# Patient Record
Sex: Female | Born: 1960 | Race: White | Hispanic: No | Marital: Single | Smoking: Never smoker
Health system: Southern US, Community
[De-identification: ages and names within clinical notes are randomized; demographics above are authoritative.]

## PROBLEM LIST (undated history)

## (undated) DIAGNOSIS — Z860100 Personal history of colon polyps, unspecified: Secondary | ICD-10-CM

## (undated) HISTORY — DX: Personal history of colon polyps, unspecified: Z86.0100

## (undated) HISTORY — PX: WISDOM TOOTH EXTRACTION: SHX21

## (undated) HISTORY — DX: Personal history of colonic polyps: Z86.010

## (undated) HISTORY — PX: APPENDECTOMY: SHX54

---

## 2013-10-16 HISTORY — PX: ABDOMINAL HYSTERECTOMY: SHX81

## 2016-06-11 DIAGNOSIS — N390 Urinary tract infection, site not specified: Secondary | ICD-10-CM | POA: Diagnosis not present

## 2016-06-23 ENCOUNTER — Other Ambulatory Visit: Payer: Self-pay | Admitting: *Deleted

## 2016-06-23 ENCOUNTER — Inpatient Hospital Stay
Admission: RE | Admit: 2016-06-23 | Discharge: 2016-06-23 | Disposition: A | Discharge status: Home / Self Care | Payer: Self-pay | Source: Ambulatory Visit | Attending: *Deleted | Admitting: *Deleted

## 2016-06-23 DIAGNOSIS — Z9289 Personal history of other medical treatment: Secondary | ICD-10-CM

## 2016-06-26 ENCOUNTER — Ambulatory Visit (INDEPENDENT_AMBULATORY_CARE_PROVIDER_SITE_OTHER): Payer: BLUE CROSS/BLUE SHIELD | Admitting: Internal Medicine

## 2016-06-26 ENCOUNTER — Encounter: Payer: Self-pay | Admitting: Internal Medicine

## 2016-06-26 VITALS — BP 120/84 | HR 86 | Temp 98.1°F | Ht 65.0 in | Wt 147.5 lb

## 2016-06-26 DIAGNOSIS — R928 Other abnormal and inconclusive findings on diagnostic imaging of breast: Secondary | ICD-10-CM

## 2016-06-26 DIAGNOSIS — Z23 Encounter for immunization: Secondary | ICD-10-CM

## 2016-06-26 DIAGNOSIS — Z0000 Encounter for general adult medical examination without abnormal findings: Secondary | ICD-10-CM

## 2016-06-26 NOTE — Addendum Note (Signed)
Addended by: Jearld Fenton on: 06/26/2016 12:05 PM   Modules accepted: Orders

## 2016-06-26 NOTE — Addendum Note (Signed)
Addended by: Lurlean Nanny on: 06/26/2016 11:53 AM   Modules accepted: Orders

## 2016-06-26 NOTE — Patient Instructions (Signed)
Health Maintenance, Female Adopting a healthy lifestyle and getting preventive care can go a long way to promote health and wellness. Talk with your health care provider about what schedule of regular examinations is right for you. This is a good chance for you to check in with your provider about disease prevention and staying healthy. In between checkups, there are plenty of things you can do on your own. Experts have done a lot of research about which lifestyle changes and preventive measures are most likely to keep you healthy. Ask your health care provider for more information. WEIGHT AND DIET  Eat a healthy diet  Be sure to include plenty of vegetables, fruits, low-fat dairy products, and lean protein.  Do not eat a lot of foods high in solid fats, added sugars, or salt.  Get regular exercise. This is one of the most important things you can do for your health.  Most adults should exercise for at least 150 minutes each week. The exercise should increase your heart rate and make you sweat (moderate-intensity exercise).  Most adults should also do strengthening exercises at least twice a week. This is in addition to the moderate-intensity exercise.  Maintain a healthy weight  Body mass index (BMI) is a measurement that can be used to identify possible weight problems. It estimates body fat based on height and weight. Your health care provider can help determine your BMI and help you achieve or maintain a healthy weight.  For females 20 years of age and older:   A BMI below 18.5 is considered underweight.  A BMI of 18.5 to 24.9 is normal.  A BMI of 25 to 29.9 is considered overweight.  A BMI of 30 and above is considered obese.  Watch levels of cholesterol and blood lipids  You should start having your blood tested for lipids and cholesterol at 56 years of age, then have this test every 5 years.  You may need to have your cholesterol levels checked more often if:  Your lipid  or cholesterol levels are high.  You are older than 56 years of age.  You are at high risk for heart disease.  CANCER SCREENING   Lung Cancer  Lung cancer screening is recommended for adults 55-80 years old who are at high risk for lung cancer because of a history of smoking.  A yearly low-dose CT scan of the lungs is recommended for people who:  Currently smoke.  Have quit within the past 15 years.  Have at least a 30-pack-year history of smoking. A pack year is smoking an average of one pack of cigarettes a day for 1 year.  Yearly screening should continue until it has been 15 years since you quit.  Yearly screening should stop if you develop a health problem that would prevent you from having lung cancer treatment.  Breast Cancer  Practice breast self-awareness. This means understanding how your breasts normally appear and feel.  It also means doing regular breast self-exams. Let your health care provider know about any changes, no matter how small.  If you are in your 20s or 30s, you should have a clinical breast exam (CBE) by a health care provider every 1-3 years as part of a regular health exam.  If you are 40 or older, have a CBE every year. Also consider having a breast X-ray (mammogram) every year.  If you have a family history of breast cancer, talk to your health care provider about genetic screening.  If you   are at high risk for breast cancer, talk to your health care provider about having an MRI and a mammogram every year.  Breast cancer gene (BRCA) assessment is recommended for women who have family members with BRCA-related cancers. BRCA-related cancers include:  Breast.  Ovarian.  Tubal.  Peritoneal cancers.  Results of the assessment will determine the need for genetic counseling and BRCA1 and BRCA2 testing. Cervical Cancer Your health care provider may recommend that you be screened regularly for cancer of the pelvic organs (ovaries, uterus, and  vagina). This screening involves a pelvic examination, including checking for microscopic changes to the surface of your cervix (Pap test). You may be encouraged to have this screening done every 3 years, beginning at age 21.  For women ages 30-65, health care providers may recommend pelvic exams and Pap testing every 3 years, or they may recommend the Pap and pelvic exam, combined with testing for human papilloma virus (HPV), every 5 years. Some types of HPV increase your risk of cervical cancer. Testing for HPV may also be done on women of any age with unclear Pap test results.  Other health care providers may not recommend any screening for nonpregnant women who are considered low risk for pelvic cancer and who do not have symptoms. Ask your health care provider if a screening pelvic exam is right for you.  If you have had past treatment for cervical cancer or a condition that could lead to cancer, you need Pap tests and screening for cancer for at least 20 years after your treatment. If Pap tests have been discontinued, your risk factors (such as having a new sexual partner) need to be reassessed to determine if screening should resume. Some women have medical problems that increase the chance of getting cervical cancer. In these cases, your health care provider may recommend more frequent screening and Pap tests. Colorectal Cancer  This type of cancer can be detected and often prevented.  Routine colorectal cancer screening usually begins at 56 years of age and continues through 56 years of age.  Your health care provider may recommend screening at an earlier age if you have risk factors for colon cancer.  Your health care provider may also recommend using home test kits to check for hidden blood in the stool.  A small camera at the end of a tube can be used to examine your colon directly (sigmoidoscopy or colonoscopy). This is done to check for the earliest forms of colorectal  cancer.  Routine screening usually begins at age 50.  Direct examination of the colon should be repeated every 5-10 years through 56 years of age. However, you may need to be screened more often if early forms of precancerous polyps or small growths are found. Skin Cancer  Check your skin from head to toe regularly.  Tell your health care provider about any new moles or changes in moles, especially if there is a change in a mole's shape or color.  Also tell your health care provider if you have a mole that is larger than the size of a pencil eraser.  Always use sunscreen. Apply sunscreen liberally and repeatedly throughout the day.  Protect yourself by wearing long sleeves, pants, a wide-brimmed hat, and sunglasses whenever you are outside. HEART DISEASE, DIABETES, AND HIGH BLOOD PRESSURE   High blood pressure causes heart disease and increases the risk of stroke. High blood pressure is more likely to develop in:  People who have blood pressure in the high end   of the normal range (130-139/85-89 mm Hg).  People who are overweight or obese.  People who are African American.  If you are 38-23 years of age, have your blood pressure checked every 3-5 years. If you are 61 years of age or older, have your blood pressure checked every year. You should have your blood pressure measured twice--once when you are at a hospital or clinic, and once when you are not at a hospital or clinic. Record the average of the two measurements. To check your blood pressure when you are not at a hospital or clinic, you can use:  An automated blood pressure machine at a pharmacy.  A home blood pressure monitor.  If you are between 45 years and 39 years old, ask your health care provider if you should take aspirin to prevent strokes.  Have regular diabetes screenings. This involves taking a blood sample to check your fasting blood sugar level.  If you are at a normal weight and have a low risk for diabetes,  have this test once every three years after 56 years of age.  If you are overweight and have a high risk for diabetes, consider being tested at a younger age or more often. PREVENTING INFECTION  Hepatitis B  If you have a higher risk for hepatitis B, you should be screened for this virus. You are considered at high risk for hepatitis B if:  You were born in a country where hepatitis B is common. Ask your health care provider which countries are considered high risk.  Your parents were born in a high-risk country, and you have not been immunized against hepatitis B (hepatitis B vaccine).  You have HIV or AIDS.  You use needles to inject street drugs.  You live with someone who has hepatitis B.  You have had sex with someone who has hepatitis B.  You get hemodialysis treatment.  You take certain medicines for conditions, including cancer, organ transplantation, and autoimmune conditions. Hepatitis C  Blood testing is recommended for:  Everyone born from [birth year] through 1966.  Anyone with known risk factors for hepatitis C. Sexually transmitted infections (STIs)  You should be screened for sexually transmitted infections (STIs) including gonorrhea and chlamydia if:  You are sexually active and are younger than 56 years of age.  You are older than 56 years of age and your health care provider tells you that you are at risk for this type of infection.  Your sexual activity has changed since you were last screened and you are at an increased risk for chlamydia or gonorrhea. Ask your health care provider if you are at risk.  If you do not have HIV, but are at risk, it may be recommended that you take a prescription medicine daily to prevent HIV infection. This is called pre-exposure prophylaxis (PrEP). You are considered at risk if:  You are sexually active and do not regularly use condoms or know the HIV status of your partner(s).  You take drugs by injection.  You are sexually  active with a partner who has HIV. Talk with your health care provider about whether you are at high risk of being infected with HIV. If you choose to begin PrEP, you should first be tested for HIV. You should then be tested every 3 months for as long as you are taking PrEP.  PREGNANCY   If you are premenopausal and you may become pregnant, ask your health care provider about preconception counseling.  If you may  become pregnant, take 400 to 800 micrograms (mcg) of folic acid every day.  If you want to prevent pregnancy, talk to your health care provider about birth control (contraception). OSTEOPOROSIS AND MENOPAUSE   Osteoporosis is a disease in which the bones lose minerals and strength with aging. This can result in serious bone fractures. Your risk for osteoporosis can be identified using a bone density scan.  If you are 61 years of age or older, or if you are at risk for osteoporosis and fractures, ask your health care provider if you should be screened.  Ask your health care provider whether you should take a calcium or vitamin D supplement to lower your risk for osteoporosis.  Menopause may have certain physical symptoms and risks.  Hormone replacement therapy may reduce some of these symptoms and risks. Talk to your health care provider about whether hormone replacement therapy is right for you.  HOME CARE INSTRUCTIONS   Schedule regular health, dental, and eye exams.  Stay current with your immunizations.   Do not use any tobacco products including cigarettes, chewing tobacco, or electronic cigarettes.  If you are pregnant, do not drink alcohol.  If you are breastfeeding, limit how much and how often you drink alcohol.  Limit alcohol intake to no more than 1 drink per day for nonpregnant women. One drink equals 12 ounces of beer, 5 ounces of wine, or 1 ounces of hard liquor.  Do not use street drugs.  Do not share needles.  Ask your health care provider for help if  you need support or information about quitting drugs.  Tell your health care provider if you often feel depressed.  Tell your health care provider if you have ever been abused or do not feel safe at home.   This information is not intended to replace advice given to you by your health care provider. Make sure you discuss any questions you have with your health care provider.   Document Released: 04/17/2011 Document Revised: 10/23/2014 Document Reviewed: 09/03/2013 Elsevier Interactive Patient Education [employer].

## 2016-06-26 NOTE — Progress Notes (Signed)
HPI  Pt presents to the clinic today to establish care. She has not had a PCP in many years. She would like her annul exam today if she could get it.  Flu: never Tetanus: > 10 years ago Pap Smear: 2015, total hysterectomy Mammogram: 11/2015, abnormal- followup in 6 months. Needs referral to [facility] Colon Screening: 2015, polyps, repeat in 5 years Vision Screening: yearly Dentist: yearly  Diet: She does not eat a lot of meat, when she does it is lean meat. She consumes fruits and veggies daily. She tries to avoid fried foods. She drinks mostly water. Exercise: She rides horses daily  Past Medical History:  Diagnosis Date  . History of colon polyps     No current outpatient prescriptions on file.   No current facility-administered medications for this visit.     No Known Allergies  Family History  Problem Relation Age of Onset  . Hypertension Mother     Social History   Social History  . Marital status: Single    Spouse name: N/A  . Number of children: N/A  . Years of education: N/A   Occupational History  . Not on file.   Social History Main Topics  . Smoking status: Never Smoker  . Smokeless tobacco: Never Used  . Alcohol use No  . Drug use: Unknown  . Sexual activity: Not on file   Other Topics Concern  . Not on file   Social History Narrative  . No narrative on file    ROS:  Constitutional: Denies fever, malaise, fatigue, headache or abrupt weight changes.  HEENT: Denies eye pain, eye redness, ear pain, ringing in the ears, wax buildup, runny nose, nasal congestion, bloody nose, or sore throat. Respiratory: Denies difficulty breathing, shortness of breath, cough or sputum production.   Cardiovascular: Denies chest pain, chest tightness, palpitations or swelling in the hands or feet.  Gastrointestinal: Pt reports occasional reflux. Denies abdominal pain, bloating, constipation, diarrhea or blood in the stool.  GU: Denies frequency, urgency, pain with  urination, blood in urine, odor or discharge. Musculoskeletal: Pt reports occasional left hip pain. Denies decrease in range of motion, difficulty with gait, muscle pain or joint swelling.  Skin: Denies redness, rashes, lesions or ulcercations.  Neurological: Denies dizziness, difficulty with memory, difficulty with speech or problems with balance and coordination.  Psych: Denies anxiety, depression, SI/HI.  No other specific complaints in a complete review of systems (except as listed in HPI above).  PE:  BP 120/84   Pulse 86   Temp 98.1 F (36.7 C) (Oral)   Ht 5\' 5"  (1.651 m)   Wt 147 lb 8 oz (66.9 kg)   LMP  (LMP Unknown) Comment: Hysterectomy  SpO2 98%   BMI 24.55 kg/m  Wt Readings from Last 3 Encounters:  06/26/16 147 lb 8 oz (66.9 kg)    General: Appears herstated age, well developed, well nourished in NAD. HEENT: Head: normal shape and size; Eyes: sclera white, no icterus, conjunctiva pink, PERRLA and EOMs intact; Ears: Tm's gray and intact, normal light reflex; Throat/Mouth: Teeth present, mucosa pink and moist, no lesions or ulcerations noted.  Neck: Neck supple, trachea midline. No masses, lumps or thyromegaly present.  Cardiovascular: Normal rate and rhythm. S1,S2 noted.  No murmur, rubs or gallops noted. No JVD or BLE edema. No carotid bruits noted. Pulmonary/Chest: Normal effort and positive vesicular breath sounds. No respiratory distress. No wheezes, rales or ronchi noted.  Abdomen: Soft and nontender. Normal bowel sounds. No distention  or masses noted. Liver, spleen and kidneys non palpable. Musculoskeletal: Normal range of motion. Strength 5/5 BUE/BLE. No signs of joint swelling. No difficulty with gait.  Neurological: Alert and oriented. Cranial nerves II-XII grossly intact. Coordination normal.  Psychiatric: Mood and affect normal. Behavior is normal. Judgment and thought content normal.    Assessment and Plan:  Preventative Health Maintenance:  She declines  flu shot today Tdap today Pap smear UTD Mammogram ordered, she will call [facility] to schedule, number provided Colon Screening UTD Encouraged her to see an eye doctor and dentist annually Encouraged her to consume a balanced diet and exercise regimen Will check CBC, CMET, Lipid Profile today She declines HIV and Hep C screening, reports she had this done in 2015 prior to her hysterectomy  RTC in 1 year, sooner if needed. Webb Silversmith, NP

## 2016-06-27 ENCOUNTER — Other Ambulatory Visit (INDEPENDENT_AMBULATORY_CARE_PROVIDER_SITE_OTHER): Payer: BLUE CROSS/BLUE SHIELD

## 2016-06-27 DIAGNOSIS — R7989 Other specified abnormal findings of blood chemistry: Secondary | ICD-10-CM

## 2016-06-27 DIAGNOSIS — Z0000 Encounter for general adult medical examination without abnormal findings: Secondary | ICD-10-CM | POA: Diagnosis not present

## 2016-06-27 LAB — LIPID PANEL
CHOL/HDL RATIO: 7
CHOLESTEROL: 230 mg/dL — AB (ref 0–200)
HDL: 32.9 mg/dL — AB (ref >39.00)
NonHDL: 197.19
TRIGLYCERIDES: 213 mg/dL — AB (ref 0.0–149.0)
VLDL: 42.6 mg/dL — AB (ref 0.0–40.0)

## 2016-06-27 LAB — COMPREHENSIVE METABOLIC PANEL
ALT: 14 U/L (ref 0–35)
AST: 18 U/L (ref 0–37)
Albumin: 4.2 g/dL (ref 3.5–5.2)
Alkaline Phosphatase: 94 U/L (ref 39–117)
BILIRUBIN TOTAL: 1.6 mg/dL — AB (ref 0.2–1.2)
BUN: 18 mg/dL (ref 6–23)
CALCIUM: 9.4 mg/dL (ref 8.4–10.5)
CHLORIDE: 105 meq/L (ref 96–112)
CO2: 32 meq/L (ref 19–32)
Creatinine, Ser: 0.71 mg/dL (ref 0.40–1.20)
GFR: 90.39 mL/min (ref >60.00)
Glucose, Bld: 89 mg/dL (ref 70–99)
Potassium: 4.2 mEq/L (ref 3.5–5.1)
Sodium: 141 mEq/L (ref 135–145)
Total Protein: 7.2 g/dL (ref 6.0–8.3)

## 2016-06-27 LAB — CBC
HEMATOCRIT: 42 % (ref 36.0–46.0)
Hemoglobin: 14.3 g/dL (ref 12.0–15.0)
MCHC: 34.2 g/dL (ref 30.0–36.0)
MCV: 90.8 fl (ref 78.0–100.0)
PLATELETS: 350 10*3/uL (ref 150.0–400.0)
RBC: 4.62 Mil/uL (ref 3.87–5.11)
RDW: 13.1 % (ref 11.5–15.5)
WBC: 7.5 10*3/uL (ref 4.0–10.5)

## 2016-06-27 LAB — LDL CHOLESTEROL, DIRECT: Direct LDL: 147 mg/dL

## 2016-07-04 ENCOUNTER — Other Ambulatory Visit: Payer: Self-pay | Admitting: *Deleted

## 2016-07-04 ENCOUNTER — Inpatient Hospital Stay
Admission: RE | Admit: 2016-07-04 | Discharge: 2016-07-04 | Disposition: A | Discharge status: Home / Self Care | Payer: Self-pay | Source: Ambulatory Visit | Attending: *Deleted | Admitting: *Deleted

## 2016-07-04 DIAGNOSIS — Z9289 Personal history of other medical treatment: Secondary | ICD-10-CM

## 2016-07-26 ENCOUNTER — Other Ambulatory Visit: Payer: Self-pay | Admitting: Internal Medicine

## 2016-07-26 ENCOUNTER — Ambulatory Visit
Admission: RE | Admit: 2016-07-26 | Discharge: 2016-07-26 | Disposition: A | Discharge status: Home / Self Care | Payer: BLUE CROSS/BLUE SHIELD | Source: Ambulatory Visit | Attending: Internal Medicine | Admitting: Internal Medicine

## 2016-07-26 DIAGNOSIS — R928 Other abnormal and inconclusive findings on diagnostic imaging of breast: Secondary | ICD-10-CM | POA: Diagnosis not present

## 2016-07-26 DIAGNOSIS — N6012 Diffuse cystic mastopathy of left breast: Secondary | ICD-10-CM | POA: Diagnosis not present

## 2016-08-10 DIAGNOSIS — Z01419 Encounter for gynecological examination (general) (routine) without abnormal findings: Secondary | ICD-10-CM | POA: Diagnosis not present

## 2016-08-10 DIAGNOSIS — N905 Atrophy of vulva: Secondary | ICD-10-CM | POA: Diagnosis not present

## 2016-08-10 DIAGNOSIS — N941 Unspecified dyspareunia: Secondary | ICD-10-CM | POA: Diagnosis not present

## 2016-11-06 ENCOUNTER — Other Ambulatory Visit: Payer: Self-pay | Admitting: Obstetrics and Gynecology

## 2016-11-06 DIAGNOSIS — N952 Postmenopausal atrophic vaginitis: Secondary | ICD-10-CM | POA: Diagnosis not present

## 2016-11-06 DIAGNOSIS — Z79890 Hormone replacement therapy (postmenopausal): Secondary | ICD-10-CM | POA: Diagnosis not present

## 2016-11-06 DIAGNOSIS — N6009 Solitary cyst of unspecified breast: Secondary | ICD-10-CM

## 2016-11-20 ENCOUNTER — Ambulatory Visit
Admission: RE | Admit: 2016-11-20 | Discharge: 2016-11-20 | Disposition: A | Discharge status: Home / Self Care | Payer: BLUE CROSS/BLUE SHIELD | Source: Ambulatory Visit | Attending: Obstetrics and Gynecology | Admitting: Obstetrics and Gynecology

## 2016-11-20 ENCOUNTER — Encounter: Payer: Self-pay | Admitting: Radiology

## 2016-11-20 DIAGNOSIS — N6009 Solitary cyst of unspecified breast: Secondary | ICD-10-CM

## 2016-11-20 DIAGNOSIS — N6002 Solitary cyst of left breast: Secondary | ICD-10-CM | POA: Insufficient documentation

## 2016-11-20 DIAGNOSIS — R928 Other abnormal and inconclusive findings on diagnostic imaging of breast: Secondary | ICD-10-CM | POA: Diagnosis not present

## 2016-11-27 IMAGING — MG MM DIGITAL DIAGNOSTIC UNILAT*L* W/ TOMO W/ CAD
8 of 12 series · 8 of 28 positions shown · non-contrast
Comparison: Left diagnostic mammogram dated 11/26/2015, bilateral
screening mammograms dated 10/23/2015 and 11/29/2013.

CLINICAL DATA: Left breast follow-up from outside mammogram and
ultrasound dated 11/26/2015.

EXAM:
2D DIGITAL DIAGNOSTIC LEFT MAMMOGRAM WITH CAD AND ADJUNCT TOMO
ULTRASOUND LEFT BREAST

[L CC (1 of 2)]
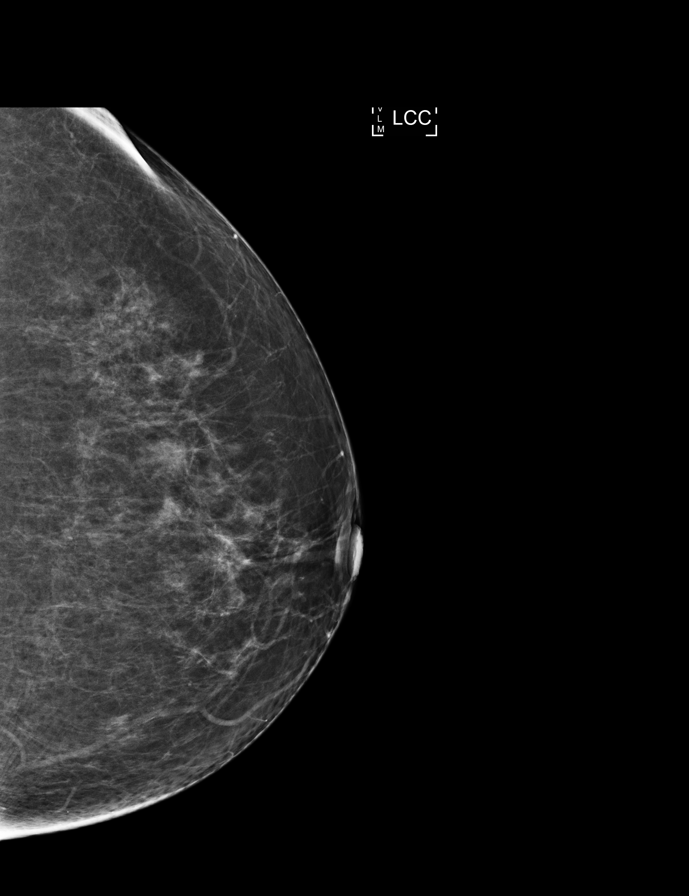

[L CC synth-2D (1 of 2)]
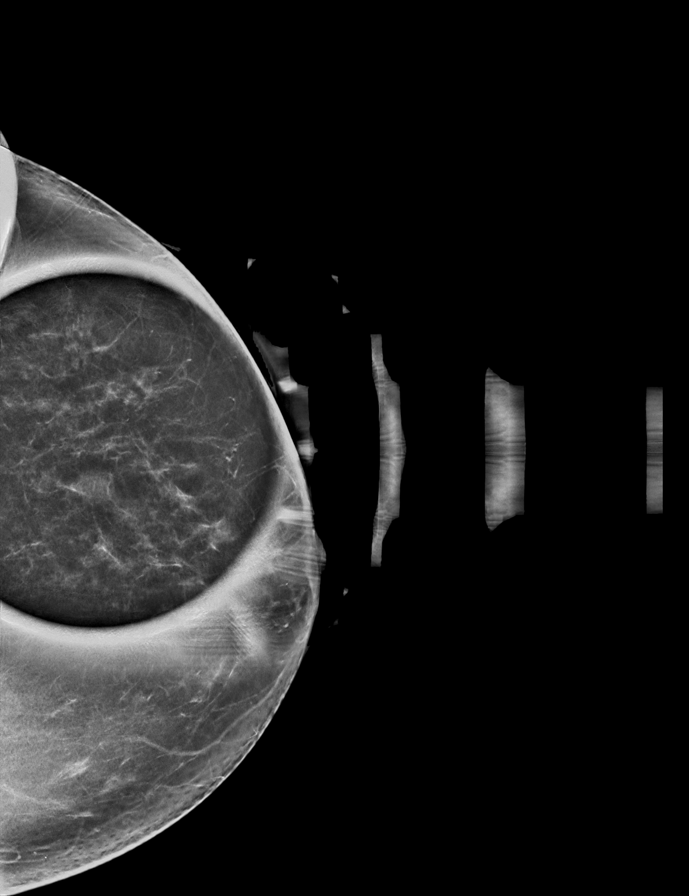

[L MLO (1 of 2)]
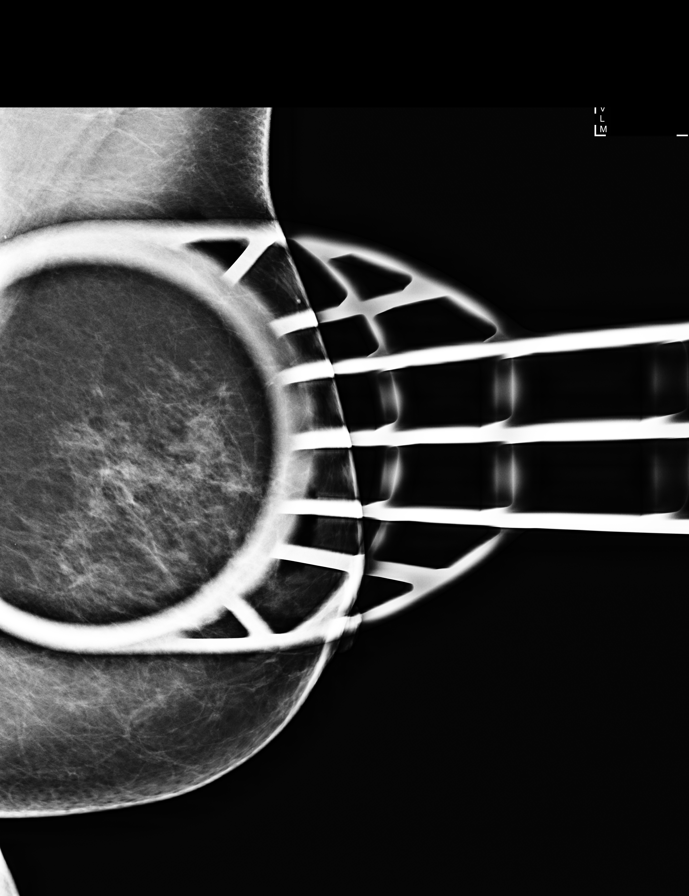

[L MLO synth-2D (1 of 2)]
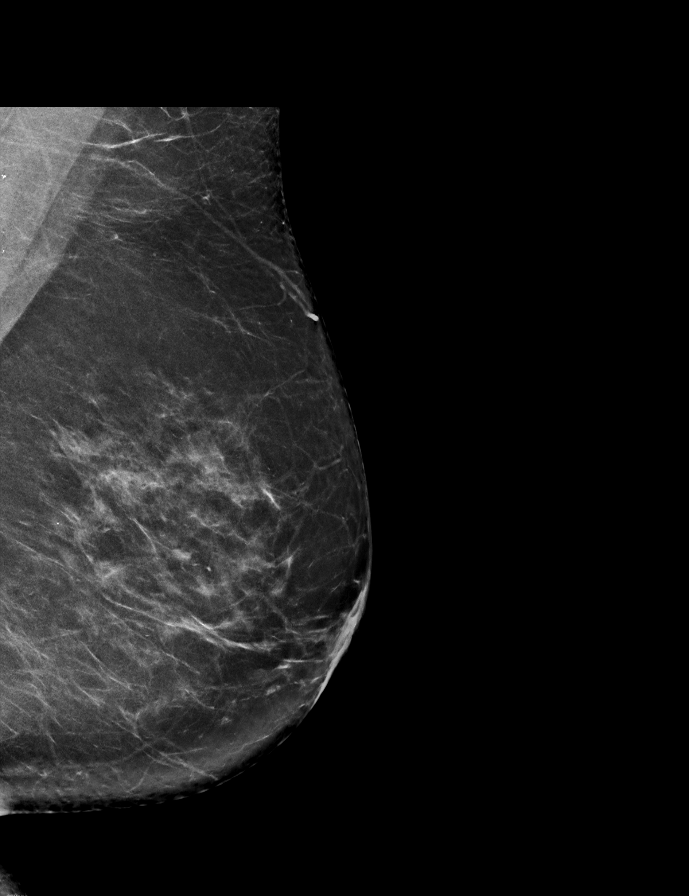

[L MLO (2 of 2)]
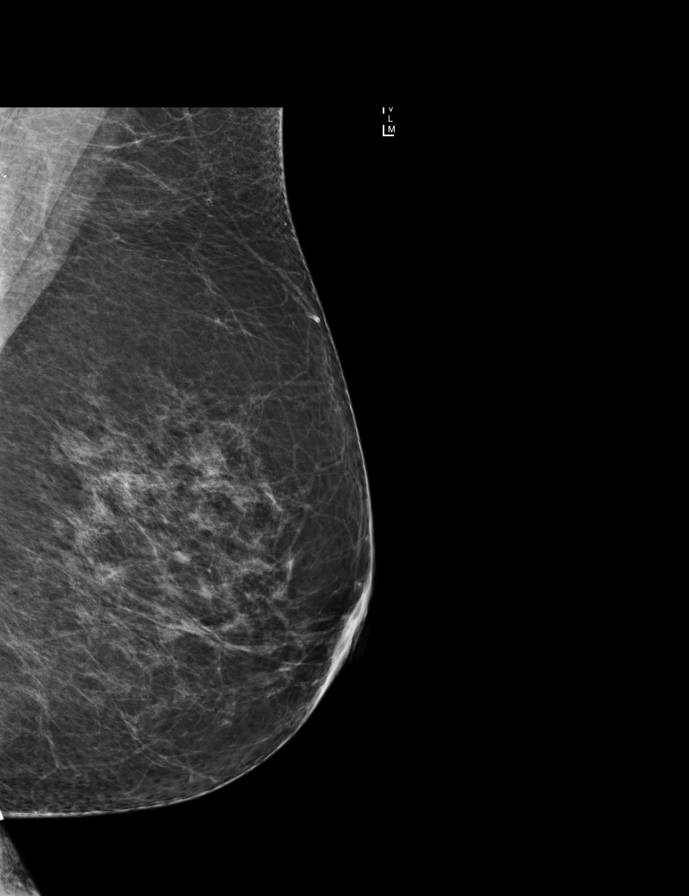

[L CC synth-2D (2 of 2)]
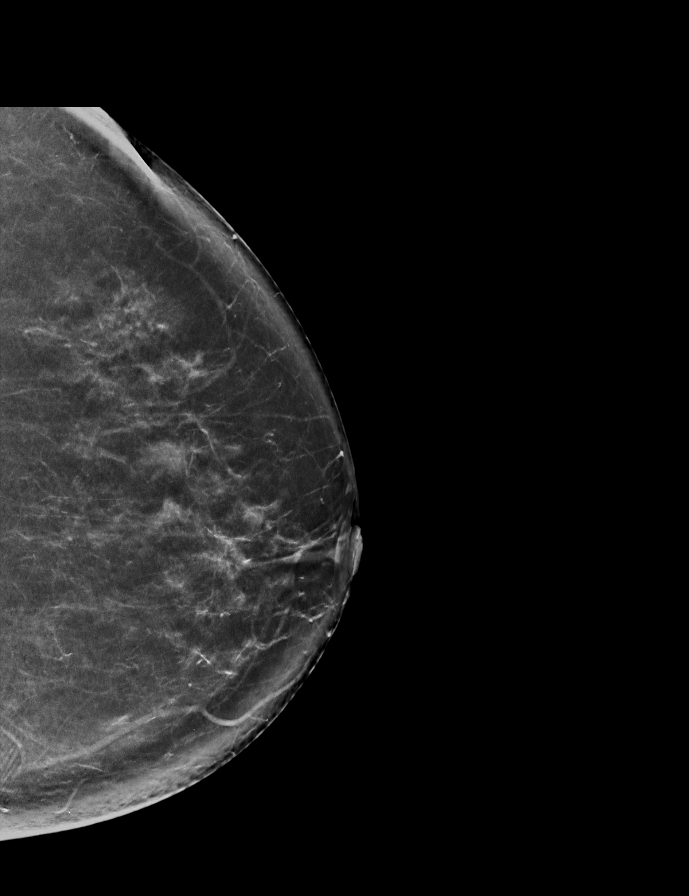

[L CC (2 of 2)]
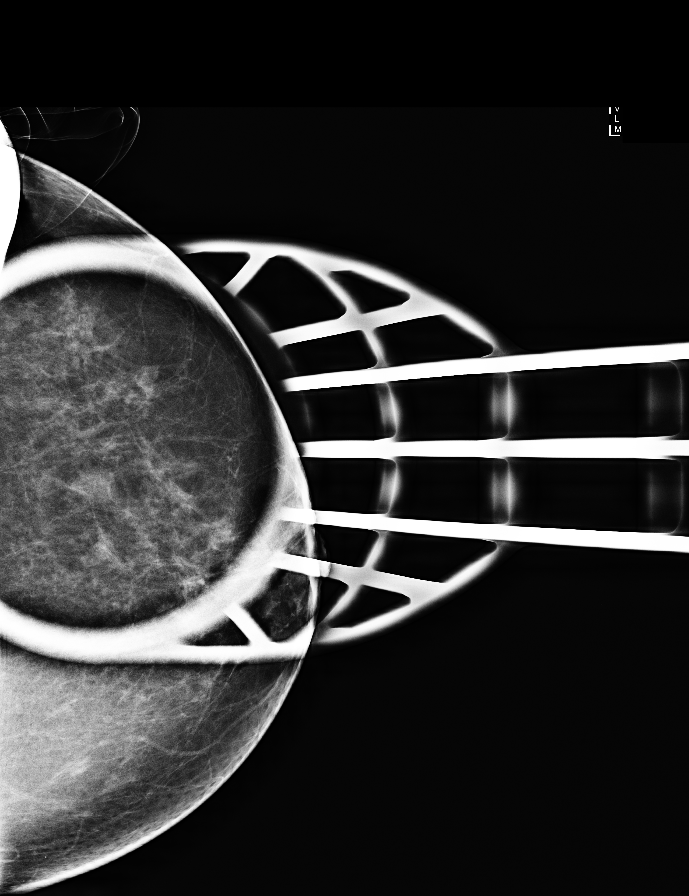

[L MLO synth-2D (2 of 2)]
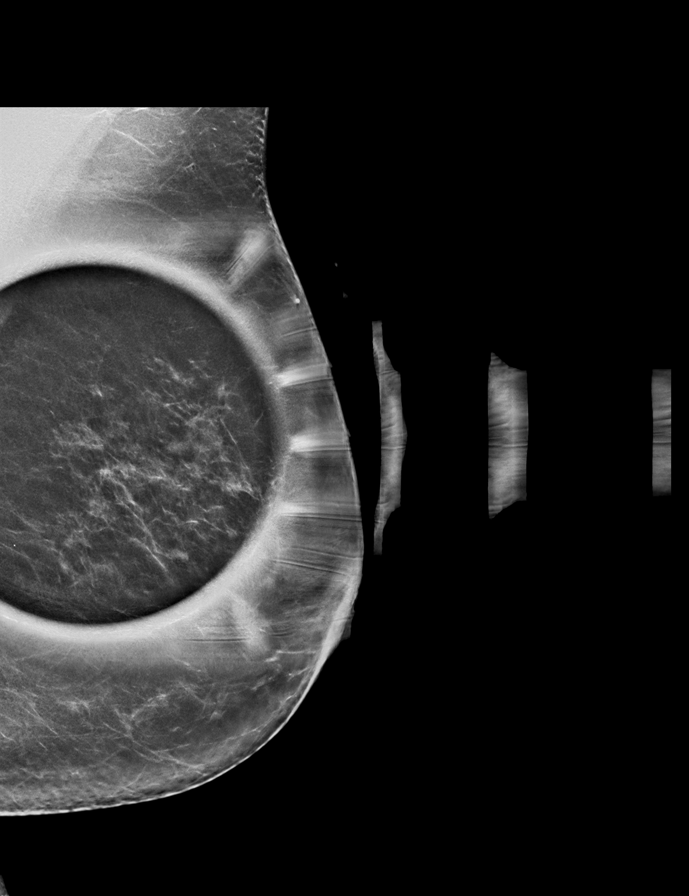

[8 of 28 positions shown; findings below may reference images not displayed]

Left breast
ultrasound dated 11/26/2015 and 01/12/2014.

ACR Breast Density Category b: There are scattered areas of
fibroglandular density.
FINDINGS: The previously noted asymmetry within the slightly lateral left
breast middle depth is not significantly changed from the November 2015 exam. No new abnormality is identified.

Mammographic images were processed with CAD.

Targeted ultrasound of the left breast demonstrates a
similar-appearing cluster of cysts at 3 o'clock, 5 cm from nipple
measuring 8 x 6 x 3 mm, decreased in size from 01/12/2014. An oval,
circumscribed, hypoechoic mass at 1 o'clock, 3 cm from the nipple
measures 9 x 4 x 9 mm, similar in appearance to the January 12, 2014
exam. This mass is thought to likely represent a fibroadenoma and
may correspond to the asymmetry seen mammographically. No suspicious
cystic or solid sonographic finding is seen in the lateral left
breast in the area of the asymmetry seen mammographically.
IMPRESSION: Probably benign left breast asymmetry.

RECOMMENDATION:
Bilateral diagnostic mammogram and possible ultrasound in 3 months.

I have discussed the findings and recommendations with the patient.
Results were also provided in writing at the conclusion of the
visit. If applicable, a reminder letter will be sent to the patient
regarding the next appointment.

BI-RADS CATEGORY  3: Probably benign.

## 2017-09-28 ENCOUNTER — Other Ambulatory Visit: Payer: Self-pay | Admitting: Obstetrics and Gynecology

## 2017-10-05 ENCOUNTER — Other Ambulatory Visit: Payer: Self-pay | Admitting: Obstetrics and Gynecology

## 2018-02-11 ENCOUNTER — Ambulatory Visit: Payer: BLUE CROSS/BLUE SHIELD | Admitting: Internal Medicine

## 2018-02-11 ENCOUNTER — Encounter: Payer: Self-pay | Admitting: Internal Medicine

## 2018-02-11 VITALS — BP 118/78 | HR 76 | Temp 98.2°F | Wt 156.0 lb

## 2018-02-11 DIAGNOSIS — N6002 Solitary cyst of left breast: Secondary | ICD-10-CM | POA: Diagnosis not present

## 2018-02-11 NOTE — Progress Notes (Signed)
Subjective:    Patient ID: Bridget Burns, female    DOB: 1960-11-26, 57 y.o.   MRN: [id number]  HPI  Pt presents to the clinic today with c/o left breast mass. She reports she has a known cyst in her left breast. She is due for follow up diagnostic mammogram and ultrasound. She denies new complaints at this time.  Review of Systems      Past Medical History:  Diagnosis Date  . History of colon polyps     Current Outpatient Medications  Medication Sig Dispense Refill  . Calcium Carb-Cholecalciferol (CALCIUM 600 + D PO) Take 1-2 tablets by mouth daily.    Marland Kitchen estradiol (ESTRACE) 0.1 MG/GM vaginal cream INSERT 1 GRAM BY VAGINAL ROUTE 2 TIMES PER WEEK 42.5 g 2  . Omega-3 Fatty Acids (FISH OIL) 1000 MG CAPS Take 1 capsule by mouth daily.     No current facility-administered medications for this visit.     No Known Allergies  Family History  Problem Relation Age of Onset  . Hypertension Mother   . Cancer Father   . Breast cancer Neg Hx     Social History   Socioeconomic History  . Marital status: Single    Spouse name: Not on file  . Number of children: Not on file  . Years of education: Not on file  . Highest education level: Not on file  Occupational History  . Not on file  Social Needs  . Financial resource strain: Not on file  . Food insecurity:    Worry: Not on file    Inability: Not on file  . Transportation needs:    Medical: Not on file    Non-medical: Not on file  Tobacco Use  . Smoking status: Never Smoker  . Smokeless tobacco: Never Used  Substance and Sexual Activity  . Alcohol use: No  . Drug use: No  . Sexual activity: Yes  Lifestyle  . Physical activity:    Days per week: Not on file    Minutes per session: Not on file  . Stress: Not on file  Relationships  . Social connections:    Talks on phone: Not on file    Gets together: Not on file    Attends religious service: Not on file    Active member of club or organization: Not on file   Attends meetings of clubs or organizations: Not on file    Relationship status: Not on file  . Intimate partner violence:    Fear of current or ex partner: Not on file    Emotionally abused: Not on file    Physically abused: Not on file    Forced sexual activity: Not on file  Other Topics Concern  . Not on file  Social History Narrative  . Not on file     Constitutional: Denies fever, malaise, fatigue, headache or abrupt weight changes.  Skin: Pt reports cyst of left breast. Denies redness, rashes, or ulcercations.    No other specific complaints in a complete review of systems (except as listed in HPI above).  Objective:   Physical Exam   BP 118/78   Pulse 76   Temp 98.2 F (36.8 C) (Oral)   Wt 156 lb (70.8 kg)   LMP  (LMP Unknown) Comment: Hysterectomy  SpO2 98%   BMI 25.96 kg/m  Wt Readings from Last 3 Encounters:  02/11/18 156 lb (70.8 kg)  06/26/16 147 lb 8 oz (66.9 kg)    General: Appears  her stated age, well developed, well nourished in NAD. Skin: Breast symmetrical. Breast with lumps or masses. No discharge expressed from the nipple.  BMET    Component Value Date/Time   NA 141 06/27/2016 0950   K 4.2 06/27/2016 0950   CL 105 06/27/2016 0950   CO2 32 06/27/2016 0950   GLUCOSE 89 06/27/2016 0950   BUN 18 06/27/2016 0950   CREATININE 0.71 06/27/2016 0950   CALCIUM 9.4 06/27/2016 0950    Lipid Panel     Component Value Date/Time   CHOL 230 (H) 06/27/2016 0950   TRIG 213.0 (H) 06/27/2016 0950   HDL 32.90 (L) 06/27/2016 0950   CHOLHDL 7 06/27/2016 0950   VLDL 42.6 (H) 06/27/2016 0950    CBC    Component Value Date/Time   WBC 7.5 06/27/2016 0950   RBC 4.62 06/27/2016 0950   HGB 14.3 06/27/2016 0950   HCT 42.0 06/27/2016 0950   PLT 350.0 06/27/2016 0950   MCV 90.8 06/27/2016 0950   MCHC 34.2 06/27/2016 0950   RDW 13.1 06/27/2016 0950    Hgb A1C No results found for: HGBA1C         Assessment & Plan:   Left Breast  Cyst:  Diagnostic mammogram and bilateral ultrasound ordered  Will follow up after results, make an appt for your annual exam Webb Silversmith, NP

## 2018-02-11 NOTE — Patient Instructions (Signed)
Breast Cyst A breast cyst is a sac in the breast that is filled with fluid. Breast cysts are usually noncancerous (benign). They are common among women, and they are most often located in the upper, outer portion of the breast. One or more cysts may develop. They form when fluid builds up inside of the breast glands. There are several types of breast cysts:  Macrocyst. This is a cyst that is about 2 inches (5.1 cm) across (in diameter).  Microcyst. This is a very small cyst that you cannot feel, but it can be seen with imaging tests such as an X-ray of the breast (mammogram) or ultrasound.  Galactocele. This is a cyst that contains milk. It may develop if you suddenly stop breastfeeding.  Breast cysts do not increase your risk of breast cancer. They usually disappear after menopause, unless you take artificial hormones (are on hormone therapy). What are the causes? The exact cause of breast cysts is not known. Possible causes include:  Blockage of tubes (ducts) in the breast glands, which leads to fluid buildup. Duct blockage may result from: ? Fibrocystic breast changes. This is a common, benign condition that occurs when women go through hormonal changes during the menstrual cycle. This is a common cause of multiple breast cysts. ? Overgrowth of breast tissue or breast glands. ? Scar tissue in the breast from previous surgery.  Changes in certain female hormones (estrogen and progesterone).  What increases the risk? You may be more likely to develop breast cysts if you have not gone through menopause. What are the signs or symptoms? Symptoms of a breast cyst may include:  Feeling one or more smooth, round, soft lumps (like grapes) in the breast that are easily moveable. The lump(s) may get bigger and more painful before your period and get smaller after your period.  Breast discomfort or pain.  How is this diagnosed? A cyst can be felt during a physical exam by your health care  provider. A mammogram and ultrasound will be done to confirm the diagnosis. Fluid may be removed from the cyst with a needle (fine-needle aspiration) and tested to make sure the cyst is not cancerous. How is this treated? Treatment may not be necessary. Your health care provider may monitor the cyst to see if it goes away on its own. If the cyst is uncomfortable or gets bigger, or if you do not like how the cyst makes your breast look, you may need treatment. Treatment may include:  Hormone treatment.  Fine-needle aspiration, to drain fluid from the cyst. There is a chance of the cyst coming back (recurring) after aspiration.  Surgery to remove the cyst.  Follow these instructions at home:  See your health care provider regularly. ? Get a yearly physical exam. ? If you are 20-40 years old, get a clinical breast exam every 1-3 years. After age 40, get this exam every year. ? Get mammograms as often as directed.  Do a breast self-exam every month, or as often as directed. Having many breast cysts, or "lumpy" breasts, may make it harder to feel for new lumps. Understand how your breasts normally look and feel, and write down any changes in your breasts so you can tell your health care provider about the changes. A breast self-exam involves: ? Comparing your breasts in the mirror. ? Looking for visible changes in your skin or nipples. ? Feeling for lumps or changes.  Take over-the-counter and prescription medicines only as told by your health care   provider.  Wear a supportive bra, especially when exercising.  Follow instructions from your health care provider about eating and drinking restrictions. ? Avoid caffeine. ? Cut down on salt (sodium) in what you eat and drink, especially before your menstrual period. Too much sodium can cause fluid buildup (retention), breast swelling, and discomfort.  Keep all follow-up visits as told your health care provider. This is important. Contact a  health care provider if:  You feel, or think you feel, a lump in your breast.  You notice that both breasts look or feel different than usual.  Your breast is still causing pain after your menstrual period is over.  You find new lumps or bumps that were not there before.  You feel lumps in your armpit (axilla). Get help right away if:  You have severe pain, tenderness, redness, or warmth in your breast.  You have fluid or blood leaking from your nipple.  Your breast lump becomes hard and painful.  You notice dimpling or wrinkling of the breast or nipple. This information is not intended to replace advice given to you by your health care provider. Make sure you discuss any questions you have with your health care provider. Document Released: 10/02/2005 Document Revised: 06/23/2016 Document Reviewed: 06/23/2016 Elsevier Interactive Patient Education  2017 Elsevier Inc.  

## 2018-02-14 ENCOUNTER — Ambulatory Visit: Payer: BLUE CROSS/BLUE SHIELD | Admitting: Internal Medicine

## 2018-03-05 ENCOUNTER — Ambulatory Visit
Admission: RE | Admit: 2018-03-05 | Discharge: 2018-03-05 | Disposition: A | Discharge status: Home / Self Care | Payer: BLUE CROSS/BLUE SHIELD | Source: Ambulatory Visit | Attending: Internal Medicine | Admitting: Internal Medicine

## 2018-03-05 DIAGNOSIS — N6002 Solitary cyst of left breast: Secondary | ICD-10-CM

## 2018-03-05 DIAGNOSIS — N6321 Unspecified lump in the left breast, upper outer quadrant: Secondary | ICD-10-CM | POA: Diagnosis not present

## 2018-03-05 DIAGNOSIS — R928 Other abnormal and inconclusive findings on diagnostic imaging of breast: Secondary | ICD-10-CM | POA: Diagnosis not present

## 2018-05-03 ENCOUNTER — Ambulatory Visit (INDEPENDENT_AMBULATORY_CARE_PROVIDER_SITE_OTHER): Payer: BLUE CROSS/BLUE SHIELD | Admitting: Obstetrics and Gynecology

## 2018-05-03 ENCOUNTER — Encounter: Payer: Self-pay | Admitting: Obstetrics and Gynecology

## 2018-05-03 VITALS — BP 128/88 | HR 73 | Ht 66.0 in | Wt 150.0 lb

## 2018-05-03 DIAGNOSIS — Z1239 Encounter for other screening for malignant neoplasm of breast: Secondary | ICD-10-CM

## 2018-05-03 DIAGNOSIS — Z1231 Encounter for screening mammogram for malignant neoplasm of breast: Secondary | ICD-10-CM

## 2018-05-03 DIAGNOSIS — Z01411 Encounter for gynecological examination (general) (routine) with abnormal findings: Secondary | ICD-10-CM

## 2018-05-03 DIAGNOSIS — Z13220 Encounter for screening for lipoid disorders: Secondary | ICD-10-CM | POA: Diagnosis not present

## 2018-05-03 DIAGNOSIS — Z1329 Encounter for screening for other suspected endocrine disorder: Secondary | ICD-10-CM

## 2018-05-03 DIAGNOSIS — Z1321 Encounter for screening for nutritional disorder: Secondary | ICD-10-CM

## 2018-05-03 DIAGNOSIS — N952 Postmenopausal atrophic vaginitis: Secondary | ICD-10-CM | POA: Diagnosis not present

## 2018-05-03 DIAGNOSIS — Z01419 Encounter for gynecological examination (general) (routine) without abnormal findings: Secondary | ICD-10-CM

## 2018-05-03 DIAGNOSIS — Z131 Encounter for screening for diabetes mellitus: Secondary | ICD-10-CM

## 2018-05-03 DIAGNOSIS — Z1211 Encounter for screening for malignant neoplasm of colon: Secondary | ICD-10-CM | POA: Diagnosis not present

## 2018-05-03 MED ORDER — ESTRADIOL 0.1 MG/GM VA CREA
1.0000 | TOPICAL_CREAM | VAGINAL | 2 refills | Status: DC
Start: 2018-05-06 — End: 2020-11-19

## 2018-05-03 NOTE — Progress Notes (Signed)
Gynecology Annual Exam  PCP: Jearld Fenton, NP  Chief Complaint:  Chief Complaint  Patient presents with  . Gynecologic Exam    [employer]str[employer] works great    History of Present Illness:Patient is a 57 y.o. G0P0000 presents for annual exam. The patient has no complaints today.   LMP: No LMP recorded (lmp unknown). Patient has had a hysterectomy.  The patient is sexually active. She denies dyspareunia.  The patient does perform self breast exams.  There is no notable family history of breast or ovarian cancer in her family.  The patient wears seatbelts: yes.   The patient has regular exercise: not asked.    The patient denies current symptoms of depression.     Review of Systems: Review of Systems  Constitutional: Negative for chills and fever.  HENT: Negative for congestion.   Eyes: Negative.   Respiratory: Negative for cough and shortness of breath.   Cardiovascular: Negative for chest pain and palpitations.  Gastrointestinal: Negative for abdominal pain, constipation, diarrhea, heartburn, nausea and vomiting.  Genitourinary: Negative for dysuria, frequency and urgency.  Musculoskeletal: Negative.   Skin: Negative for itching and rash.  Neurological: Negative for dizziness and headaches.  Endo/Heme/Allergies: Negative for polydipsia.  Psychiatric/Behavioral: Negative for depression.    Past Medical History:  Past Medical History:  Diagnosis Date  . History of colon polyps     Past Surgical History:  Past Surgical History:  Procedure Laterality Date  . ABDOMINAL HYSTERECTOMY  2015   total  . APPENDECTOMY    . WISDOM TOOTH EXTRACTION      Gynecologic History:  No LMP recorded (lmp unknown). Patient has had a hysterectomy. Last Pap: Results were: N/A s/p abdominal hysterectomy  Last mammogram: 03/05/2018 Results were: BI-RAD I  Obstetric History: G0P0000  Family History:  Family History  Problem Relation Age of Onset  . Hypertension Mother   . Cancer  Father   . Breast cancer Neg Hx     Social History:  Social History   Socioeconomic History  . Marital status: Single    Spouse name: Not on file  . Number of children: Not on file  . Years of education: Not on file  . Highest education level: Not on file  Occupational History  . Not on file  Social Needs  . Financial resource strain: Not on file  . Food insecurity:    Worry: Not on file    Inability: Not on file  . Transportation needs:    Medical: Not on file    Non-medical: Not on file  Tobacco Use  . Smoking status: Never Smoker  . Smokeless tobacco: Never Used  Substance and Sexual Activity  . Alcohol use: No  . Drug use: No  . Sexual activity: Yes  Lifestyle  . Physical activity:    Days per week: Not on file    Minutes per session: Not on file  . Stress: Not on file  Relationships  . Social connections:    Talks on phone: Not on file    Gets together: Not on file    Attends religious service: Not on file    Active member of club or organization: Not on file    Attends meetings of clubs or organizations: Not on file    Relationship status: Not on file  . Intimate partner violence:    Fear of current or ex partner: Not on file    Emotionally abused: Not on file    Physically  abused: Not on file    Forced sexual activity: Not on file  Other Topics Concern  . Not on file  Social History Narrative  . Not on file    Allergies:  No Known Allergies  Medications: Prior to Admission medications   Medication Sig Start Date End Date Taking? Authorizing Provider  Calcium Carb-Cholecalciferol (CALCIUM 600 + D PO) Take 1-2 tablets by mouth daily.   Yes [provider]  estradiol (ESTRACE) 0.1 MG/GM vaginal cream INSERT 1 GRAM BY VAGINAL ROUTE 2 TIMES PER WEEK 10/05/17  Yes Malachy Mood, MD  Omega-3 Fatty Acids (FISH OIL) 1000 MG CAPS Take 1 capsule by mouth daily.   Yes [provider]    Physical Exam Vitals: Blood pressure 128/88,  pulse 73, height 5\' 6"  (1.676 m), weight 150 lb (68 kg).  General: NAD HEENT: normocephalic, anicteric Thyroid: no enlargement, no palpable nodules Pulmonary: No increased work of breathing, CTAB Cardiovascular: RRR, distal pulses 2+ Breast: Breast symmetrical, no tenderness, no palpable nodules or masses, no skin or nipple retraction present, no nipple discharge.  No axillary or supraclavicular lymphadenopathy. Abdomen: NABS, soft, non-tender, non-distended.  Umbilicus without lesions.  No hepatomegaly, splenomegaly or masses palpable. No evidence of hernia  Genitourinary:  External: Normal external female genitalia.  Normal urethral meatus, normal Bartholin's and Skene's glands.    Vagina: Normal vaginal mucosa, no evidence of prolapse.    Cervix: absent  Uterus: absent  Adnexa: absent  Rectal: deferred  Lymphatic: no evidence of inguinal lymphadenopathy Extremities: no edema, erythema, or tenderness Neurologic: Grossly intact Psychiatric: mood appropriate, affect full  Female chaperone present for pelvic and breast  portions of the physical exam     Assessment: 57 y.o. G0P0000 routine annual exam  Plan: Problem List Items Addressed This Visit    None    Visit Diagnoses    Encounter for gynecological examination without abnormal finding    -  Primary   Relevant Orders   CMP14+LP+TP+TSH+CBC/Plt   Vitamin D (25 hydroxy)   Breast screening       Vaginal atrophy       Screening cholesterol level       Relevant Orders   CMP14+LP+TP+TSH+CBC/Plt   Screening for diabetes mellitus       Relevant Orders   CMP14+LP+TP+TSH+CBC/Plt   Encounter for vitamin deficiency screening       Relevant Orders   Vitamin D (25 hydroxy)   Thyroid disorder screening       Relevant Orders   CMP14+LP+TP+TSH+CBC/Plt   Colon cancer screening       Relevant Orders   Ambulatory referral to Gastroenterology      1) Mammogram - recommend yearly screening mammogram.  Mammogram Is up to date  2)  STI screening  was notoffered and therefore not obtained  3) ASCCP guidelines and rational discussed.  Patient opts for discontinue secondary to prior hysterectomy screening interval  4) Osteoporosis  - per USPTF routine screening DEXA at age 76 - FRAX 51 year major fracture risk 6.9%,  10 year hip fracture risk 0.6%  Consider FDA-approved medical therapies in postmenopausal women and men aged 50 years and older, based on the following: a) A hip or vertebral (clinical or morphometric) fracture b) T-score ? -2.5 at the femoral neck or spine after appropriate evaluation to exclude secondary causes C) Low bone mass (T-score between -1.0 and -2.5 at the femoral neck or spine) and a 10-year probability of a hip fracture ? 3% or a 10-year probability  of a major osteoporosis-related fracture ? 20% based on the [place]-adapted WHO algorithm   5) Routine healthcare maintenance including cholesterol, diabetes screening discussed To return fasting at a later date  6) Colonoscopy last 4 years ago in [place] with polyps noted and 5 year follow recommended (due January 2020 per patient)  7) Return in about 1 year (around 05/04/2019) for annual, next 1-4 week fasting blood work.    Malachy Mood, MD [facility] P[facility], [facility] 05/03/2018, 2:02 PM

## 2018-05-03 NOTE — Patient Instructions (Signed)
Preventive Care 40-64 Years, Female Preventive care refers to lifestyle choices and visits with your health care provider that can promote health and wellness. What does preventive care include?  A yearly physical exam. This is also called an annual well check.  Dental exams once or twice a year.  Routine eye exams. Ask your health care provider how often you should have your eyes checked.  Personal lifestyle choices, including: ? Daily care of your teeth and gums. ? Regular physical activity. ? Eating a healthy diet. ? Avoiding tobacco and drug use. ? Limiting alcohol use. ? Practicing safe sex. ? Taking low-dose aspirin daily starting at age 58. ? Taking vitamin and mineral supplements as recommended by your health care provider. What happens during an annual well check? The services and screenings done by your health care provider during your annual well check will depend on your age, overall health, lifestyle risk factors, and family history of disease. Counseling Your health care provider may ask you questions about your:  Alcohol use.  Tobacco use.  Drug use.  Emotional well-being.  Home and relationship well-being.  Sexual activity.  Eating habits.  Work and work Statistician.  Method of birth control.  Menstrual cycle.  Pregnancy history.  Screening You may have the following tests or measurements:  Height, weight, and BMI.  Blood pressure.  Lipid and cholesterol levels. These may be checked every 5 years, or more frequently if you are over 81 years old.  Skin check.  Lung cancer screening. You may have this screening every year starting at age 78 if you have a 30-pack-year history of smoking and currently smoke or have quit within the past 15 years.  Fecal occult blood test (FOBT) of the stool. You may have this test every year starting at age 65.  Flexible sigmoidoscopy or colonoscopy. You may have a sigmoidoscopy every 5 years or a colonoscopy  every 10 years starting at age 30.  Hepatitis C blood test.  Hepatitis B blood test.  Sexually transmitted disease (STD) testing.  Diabetes screening. This is done by checking your blood sugar (glucose) after you have not eaten for a while (fasting). You may have this done every 1-3 years.  Mammogram. This may be done every 1-2 years. Talk to your health care provider about when you should start having regular mammograms. This may depend on whether you have a family history of breast cancer.  BRCA-related cancer screening. This may be done if you have a family history of breast, ovarian, tubal, or peritoneal cancers.  Pelvic exam and Pap test. This may be done every 3 years starting at age 80. Starting at age 36, this may be done every 5 years if you have a Pap test in combination with an HPV test.  Bone density scan. This is done to screen for osteoporosis. You may have this scan if you are at high risk for osteoporosis.  Discuss your test results, treatment options, and if necessary, the need for more tests with your health care provider. Vaccines Your health care provider may recommend certain vaccines, such as:  Influenza vaccine. This is recommended every year.  Tetanus, diphtheria, and acellular pertussis (Tdap, Td) vaccine. You may need a Td booster every 10 years.  Varicella vaccine. You may need this if you have not been vaccinated.  Zoster vaccine. You may need this after age 5.  Measles, mumps, and rubella (MMR) vaccine. You may need at least one dose of MMR if you were born in  1957 or later. You may also need a second dose.  Pneumococcal 13-valent conjugate (PCV13) vaccine. You may need this if you have certain conditions and were not previously vaccinated.  Pneumococcal polysaccharide (PPSV23) vaccine. You may need one or two doses if you smoke cigarettes or if you have certain conditions.  Meningococcal vaccine. You may need this if you have certain  conditions.  Hepatitis A vaccine. You may need this if you have certain conditions or if you travel or work in places where you may be exposed to hepatitis A.  Hepatitis B vaccine. You may need this if you have certain conditions or if you travel or work in places where you may be exposed to hepatitis B.  Haemophilus influenzae type b (Hib) vaccine. You may need this if you have certain conditions.  Talk to your health care provider about which screenings and vaccines you need and how often you need them. This information is not intended to replace advice given to you by your health care provider. Make sure you discuss any questions you have with your health care provider. Document Released: 10/29/2015 Document Revised: 06/21/2016 Document Reviewed: 08/03/2015 Elsevier Interactive Patient Education  2018 Elsevier Inc.  

## 2018-05-06 ENCOUNTER — Other Ambulatory Visit: Payer: BLUE CROSS/BLUE SHIELD

## 2018-05-06 DIAGNOSIS — Z13220 Encounter for screening for lipoid disorders: Secondary | ICD-10-CM

## 2018-05-06 DIAGNOSIS — Z01419 Encounter for gynecological examination (general) (routine) without abnormal findings: Secondary | ICD-10-CM

## 2018-05-06 DIAGNOSIS — Z131 Encounter for screening for diabetes mellitus: Secondary | ICD-10-CM | POA: Diagnosis not present

## 2018-05-06 DIAGNOSIS — Z1329 Encounter for screening for other suspected endocrine disorder: Secondary | ICD-10-CM

## 2018-05-06 DIAGNOSIS — Z1321 Encounter for screening for nutritional disorder: Secondary | ICD-10-CM

## 2018-05-07 LAB — CMP14+LP+TP+TSH+CBC/PLT
A/G RATIO: 1.9 (ref 1.2–2.2)
ALBUMIN: 4.4 g/dL (ref 3.5–5.5)
ALT: 17 IU/L (ref 0–32)
AST: 16 IU/L (ref 0–40)
Alkaline Phosphatase: 99 IU/L (ref 39–117)
BUN/Creatinine Ratio: 22 (ref 9–23)
BUN: 17 mg/dL (ref 6–24)
Bilirubin Total: 1.3 mg/dL — ABNORMAL HIGH (ref 0.0–1.2)
CHLORIDE: 105 mmol/L (ref 96–106)
CO2: 22 mmol/L (ref 20–29)
Calcium: 9.5 mg/dL (ref 8.7–10.2)
Cholesterol, Total: 260 mg/dL — ABNORMAL HIGH (ref 100–199)
Creatinine, Ser: 0.78 mg/dL (ref 0.57–1.00)
Free Thyroxine Index: 1.4 (ref 1.2–4.9)
GFR calc Af Amer: 97 mL/min/{1.73_m2} (ref >59)
GFR calc non Af Amer: 84 mL/min/{1.73_m2} (ref >59)
GLOBULIN, TOTAL: 2.3 g/dL (ref 1.5–4.5)
Glucose: 110 mg/dL — ABNORMAL HIGH (ref 65–99)
HDL: 42 mg/dL (ref >39)
Hematocrit: 44.1 % (ref 34.0–46.6)
Hemoglobin: 14.8 g/dL (ref 11.1–15.9)
LDL CALC: 185 mg/dL — AB (ref 0–99)
LDL/HDL RATIO: 4.4 ratio — AB (ref 0.0–3.2)
MCH: 31.2 pg (ref 26.6–33.0)
MCHC: 33.6 g/dL (ref 31.5–35.7)
MCV: 93 fL (ref 79–97)
PLATELETS: 352 10*3/uL (ref 150–450)
POTASSIUM: 4.3 mmol/L (ref 3.5–5.2)
RBC: 4.75 x10E6/uL (ref 3.77–5.28)
RDW: 14 % (ref 12.3–15.4)
Sodium: 143 mmol/L (ref 134–144)
T3 UPTAKE RATIO: 27 % (ref 24–39)
T4 TOTAL: 5.2 ug/dL (ref 4.5–12.0)
TOTAL PROTEIN: 6.7 g/dL (ref 6.0–8.5)
TSH: 2.72 u[IU]/mL (ref 0.450–4.500)
Triglycerides: 167 mg/dL — ABNORMAL HIGH (ref 0–149)
VLDL Cholesterol Cal: 33 mg/dL (ref 5–40)
WBC: 7.1 10*3/uL (ref 3.4–10.8)

## 2018-05-07 LAB — VITAMIN D 25 HYDROXY (VIT D DEFICIENCY, FRACTURES): Vit D, 25-Hydroxy: 10.8 ng/mL — ABNORMAL LOW (ref 30.0–100.0)

## 2018-05-13 ENCOUNTER — Encounter (INDEPENDENT_AMBULATORY_CARE_PROVIDER_SITE_OTHER): Payer: Self-pay

## 2018-05-13 ENCOUNTER — Other Ambulatory Visit: Payer: Self-pay | Admitting: Obstetrics and Gynecology

## 2018-05-13 MED ORDER — VITAMIN D (ERGOCALCIFEROL) 1.25 MG (50000 UNIT) PO CAPS: 50000.0000 [IU] | ORAL_CAPSULE | ORAL | 0 refills | Status: AC

## 2018-05-29 ENCOUNTER — Encounter: Payer: Self-pay | Admitting: *Deleted

## 2018-05-31 ENCOUNTER — Ambulatory Visit: Payer: BLUE CROSS/BLUE SHIELD | Admitting: Psychology

## 2018-05-31 DIAGNOSIS — F410 Panic disorder [episodic paroxysmal anxiety] without agoraphobia: Secondary | ICD-10-CM

## 2018-06-12 ENCOUNTER — Ambulatory Visit: Payer: BLUE CROSS/BLUE SHIELD | Admitting: Psychology

## 2018-06-12 DIAGNOSIS — F410 Panic disorder [episodic paroxysmal anxiety] without agoraphobia: Secondary | ICD-10-CM | POA: Diagnosis not present

## 2018-06-14 ENCOUNTER — Telehealth: Payer: Self-pay | Admitting: Obstetrics and Gynecology

## 2018-06-14 ENCOUNTER — Other Ambulatory Visit: Payer: Self-pay | Admitting: Obstetrics and Gynecology

## 2018-06-14 DIAGNOSIS — Z1321 Encounter for screening for nutritional disorder: Secondary | ICD-10-CM

## 2018-06-14 NOTE — Telephone Encounter (Signed)
Annual was 05/03/18. Please advise

## 2018-06-14 NOTE — Telephone Encounter (Signed)
Called and left voice mail for patient to call back to be schedule °

## 2018-06-14 NOTE — Telephone Encounter (Signed)
Needs lab draw to see if vitamin D is sufficiently up or if she needs to continue vitamin D

## 2018-06-19 ENCOUNTER — Ambulatory Visit: Payer: BLUE CROSS/BLUE SHIELD | Admitting: Psychology

## 2018-06-19 DIAGNOSIS — F410 Panic disorder [episodic paroxysmal anxiety] without agoraphobia: Secondary | ICD-10-CM

## 2018-06-26 ENCOUNTER — Ambulatory Visit: Payer: BLUE CROSS/BLUE SHIELD | Admitting: Psychology

## 2018-06-26 DIAGNOSIS — F410 Panic disorder [episodic paroxysmal anxiety] without agoraphobia: Secondary | ICD-10-CM | POA: Diagnosis not present

## 2018-07-03 ENCOUNTER — Ambulatory Visit: Payer: BLUE CROSS/BLUE SHIELD | Admitting: Psychology

## 2018-07-03 DIAGNOSIS — F410 Panic disorder [episodic paroxysmal anxiety] without agoraphobia: Secondary | ICD-10-CM

## 2018-07-10 ENCOUNTER — Ambulatory Visit: Payer: BLUE CROSS/BLUE SHIELD | Admitting: Psychology

## 2018-07-10 DIAGNOSIS — F410 Panic disorder [episodic paroxysmal anxiety] without agoraphobia: Secondary | ICD-10-CM | POA: Diagnosis not present

## 2018-07-17 ENCOUNTER — Ambulatory Visit: Payer: BLUE CROSS/BLUE SHIELD | Admitting: Psychology

## 2018-07-17 DIAGNOSIS — F410 Panic disorder [episodic paroxysmal anxiety] without agoraphobia: Secondary | ICD-10-CM | POA: Diagnosis not present

## 2018-07-24 ENCOUNTER — Ambulatory Visit: Payer: BLUE CROSS/BLUE SHIELD | Admitting: Psychology

## 2018-07-24 DIAGNOSIS — F410 Panic disorder [episodic paroxysmal anxiety] without agoraphobia: Secondary | ICD-10-CM

## 2018-07-31 ENCOUNTER — Ambulatory Visit: Payer: BLUE CROSS/BLUE SHIELD | Admitting: Psychology

## 2018-07-31 DIAGNOSIS — F410 Panic disorder [episodic paroxysmal anxiety] without agoraphobia: Secondary | ICD-10-CM | POA: Diagnosis not present

## 2018-08-07 ENCOUNTER — Ambulatory Visit: Payer: BLUE CROSS/BLUE SHIELD | Admitting: Psychology

## 2018-08-07 DIAGNOSIS — F410 Panic disorder [episodic paroxysmal anxiety] without agoraphobia: Secondary | ICD-10-CM

## 2018-08-14 ENCOUNTER — Ambulatory Visit: Payer: BLUE CROSS/BLUE SHIELD | Admitting: Psychology

## 2018-08-21 ENCOUNTER — Ambulatory Visit: Payer: BLUE CROSS/BLUE SHIELD | Admitting: Psychology

## 2018-08-21 DIAGNOSIS — F410 Panic disorder [episodic paroxysmal anxiety] without agoraphobia: Secondary | ICD-10-CM

## 2018-08-28 ENCOUNTER — Ambulatory Visit: Payer: BLUE CROSS/BLUE SHIELD | Admitting: Psychology

## 2018-08-28 DIAGNOSIS — F410 Panic disorder [episodic paroxysmal anxiety] without agoraphobia: Secondary | ICD-10-CM

## 2018-09-04 ENCOUNTER — Ambulatory Visit: Payer: BLUE CROSS/BLUE SHIELD | Admitting: Psychology

## 2018-09-04 DIAGNOSIS — F410 Panic disorder [episodic paroxysmal anxiety] without agoraphobia: Secondary | ICD-10-CM | POA: Diagnosis not present

## 2018-09-11 ENCOUNTER — Ambulatory Visit: Payer: BLUE CROSS/BLUE SHIELD | Admitting: Psychology

## 2018-09-18 ENCOUNTER — Other Ambulatory Visit: Payer: Self-pay

## 2018-09-18 ENCOUNTER — Ambulatory Visit: Payer: BLUE CROSS/BLUE SHIELD | Admitting: Psychology

## 2018-09-18 DIAGNOSIS — F410 Panic disorder [episodic paroxysmal anxiety] without agoraphobia: Secondary | ICD-10-CM

## 2018-09-18 DIAGNOSIS — Z86010 Personal history of colonic polyps: Secondary | ICD-10-CM

## 2018-09-25 ENCOUNTER — Ambulatory Visit: Payer: BLUE CROSS/BLUE SHIELD | Admitting: Psychology

## 2018-09-25 DIAGNOSIS — F410 Panic disorder [episodic paroxysmal anxiety] without agoraphobia: Secondary | ICD-10-CM

## 2018-10-02 ENCOUNTER — Ambulatory Visit: Payer: BLUE CROSS/BLUE SHIELD | Admitting: Psychology

## 2018-10-02 DIAGNOSIS — F410 Panic disorder [episodic paroxysmal anxiety] without agoraphobia: Secondary | ICD-10-CM

## 2018-10-07 ENCOUNTER — Telehealth: Payer: Self-pay | Admitting: Gastroenterology

## 2018-10-07 NOTE — Telephone Encounter (Signed)
Patient called in to cancel her colonoscopy scheduled for 10-11-18 with Dr Vicente Males. Please call to reschedule([phone]).

## 2018-10-07 NOTE — Telephone Encounter (Signed)
Returned patients call to cancel her colonoscopy-she did not wish to reschedule her 10/11/18  procedure with Dr. Vicente Males at this time, but will call back to schedule after the holidays.  Trish in Endoscopy has been made aware.  Precert had already been completed referral closed.  Thanks [employer]

## 2018-10-11 ENCOUNTER — Ambulatory Visit
Admission: RE | Admit: 2018-10-11 | Payer: BLUE CROSS/BLUE SHIELD | Source: Ambulatory Visit | Admitting: Gastroenterology

## 2018-10-11 ENCOUNTER — Encounter: Admission: RE | Payer: Self-pay | Source: Ambulatory Visit

## 2018-10-11 SURGERY — COLONOSCOPY WITH PROPOFOL
Anesthesia: General

## 2018-10-14 ENCOUNTER — Ambulatory Visit: Payer: BLUE CROSS/BLUE SHIELD | Admitting: Psychology

## 2018-10-14 DIAGNOSIS — F410 Panic disorder [episodic paroxysmal anxiety] without agoraphobia: Secondary | ICD-10-CM | POA: Diagnosis not present

## 2018-10-23 ENCOUNTER — Ambulatory Visit: Payer: BLUE CROSS/BLUE SHIELD | Admitting: Psychology

## 2018-10-23 DIAGNOSIS — F410 Panic disorder [episodic paroxysmal anxiety] without agoraphobia: Secondary | ICD-10-CM

## 2018-10-30 ENCOUNTER — Ambulatory Visit: Payer: BLUE CROSS/BLUE SHIELD | Admitting: Psychology

## 2018-11-06 ENCOUNTER — Ambulatory Visit: Payer: BLUE CROSS/BLUE SHIELD | Admitting: Psychology

## 2018-11-06 DIAGNOSIS — F410 Panic disorder [episodic paroxysmal anxiety] without agoraphobia: Secondary | ICD-10-CM | POA: Diagnosis not present

## 2018-11-13 ENCOUNTER — Ambulatory Visit: Payer: BLUE CROSS/BLUE SHIELD | Admitting: Psychology

## 2018-11-18 DIAGNOSIS — Z0100 Encounter for examination of eyes and vision without abnormal findings: Secondary | ICD-10-CM | POA: Diagnosis not present

## 2018-11-20 ENCOUNTER — Ambulatory Visit: Payer: BLUE CROSS/BLUE SHIELD | Admitting: Psychology

## 2018-11-20 DIAGNOSIS — F410 Panic disorder [episodic paroxysmal anxiety] without agoraphobia: Secondary | ICD-10-CM | POA: Diagnosis not present

## 2018-11-27 ENCOUNTER — Ambulatory Visit: Payer: BLUE CROSS/BLUE SHIELD | Admitting: Psychology

## 2018-11-28 ENCOUNTER — Other Ambulatory Visit: Payer: Self-pay

## 2018-11-28 ENCOUNTER — Telehealth: Payer: Self-pay | Admitting: Gastroenterology

## 2018-11-28 DIAGNOSIS — Z86010 Personal history of colonic polyps: Secondary | ICD-10-CM

## 2018-11-28 NOTE — Telephone Encounter (Signed)
Returned patients call, LVM for pt to call office to reschedule colonoscopy.  Thanks [employer]

## 2018-11-28 NOTE — Telephone Encounter (Signed)
Patient called to rescheduled her colonoscopy she cancelled in December.

## 2018-12-11 ENCOUNTER — Ambulatory Visit: Payer: BLUE CROSS/BLUE SHIELD | Admitting: Psychology

## 2018-12-11 DIAGNOSIS — F410 Panic disorder [episodic paroxysmal anxiety] without agoraphobia: Secondary | ICD-10-CM

## 2018-12-18 ENCOUNTER — Ambulatory Visit (INDEPENDENT_AMBULATORY_CARE_PROVIDER_SITE_OTHER): Payer: BLUE CROSS/BLUE SHIELD | Admitting: Psychology

## 2018-12-18 DIAGNOSIS — F410 Panic disorder [episodic paroxysmal anxiety] without agoraphobia: Secondary | ICD-10-CM

## 2018-12-20 ENCOUNTER — Ambulatory Visit: Payer: BLUE CROSS/BLUE SHIELD | Admitting: Anesthesiology

## 2018-12-20 ENCOUNTER — Encounter
Admission: RE | Disposition: A | Discharge status: Home / Self Care | Payer: Self-pay | Source: Home / Self Care | Attending: Gastroenterology

## 2018-12-20 ENCOUNTER — Ambulatory Visit
Admission: RE | Admit: 2018-12-20 | Discharge: 2018-12-20 | Disposition: A | Discharge status: Home / Self Care | Payer: BLUE CROSS/BLUE SHIELD | Attending: Gastroenterology | Admitting: Gastroenterology

## 2018-12-20 DIAGNOSIS — Z79899 Other long term (current) drug therapy: Secondary | ICD-10-CM | POA: Diagnosis not present

## 2018-12-20 DIAGNOSIS — Z90710 Acquired absence of both cervix and uterus: Secondary | ICD-10-CM | POA: Insufficient documentation

## 2018-12-20 DIAGNOSIS — Z860100 Personal history of colon polyps, unspecified: Secondary | ICD-10-CM

## 2018-12-20 DIAGNOSIS — D122 Benign neoplasm of ascending colon: Secondary | ICD-10-CM | POA: Diagnosis not present

## 2018-12-20 DIAGNOSIS — Z8249 Family history of ischemic heart disease and other diseases of the circulatory system: Secondary | ICD-10-CM | POA: Diagnosis not present

## 2018-12-20 DIAGNOSIS — Z809 Family history of malignant neoplasm, unspecified: Secondary | ICD-10-CM | POA: Diagnosis not present

## 2018-12-20 DIAGNOSIS — Z1211 Encounter for screening for malignant neoplasm of colon: Secondary | ICD-10-CM | POA: Diagnosis not present

## 2018-12-20 DIAGNOSIS — K635 Polyp of colon: Secondary | ICD-10-CM | POA: Diagnosis not present

## 2018-12-20 HISTORY — PX: COLONOSCOPY WITH PROPOFOL: SHX5780

## 2018-12-20 SURGERY — COLONOSCOPY WITH PROPOFOL
Anesthesia: General

## 2018-12-20 MED ORDER — PROPOFOL 500 MG/50ML IV EMUL
INTRAVENOUS | Status: DC | PRN
  Administered 2018-12-20: 150 ug/kg/min via INTRAVENOUS

## 2018-12-20 MED ORDER — PROPOFOL 10 MG/ML IV BOLUS
INTRAVENOUS | Status: DC | PRN
  Administered 2018-12-20: 70 mg via INTRAVENOUS

## 2018-12-20 MED ORDER — LIDOCAINE HCL (PF) 2 % IJ SOLN
INTRAMUSCULAR | Status: AC
  Filled 2018-12-20: qty 10

## 2018-12-20 MED ORDER — GLYCOPYRROLATE 0.2 MG/ML IJ SOLN
INTRAMUSCULAR | Status: AC
  Filled 2018-12-20: qty 1

## 2018-12-20 MED ORDER — SODIUM CHLORIDE 0.9 % IV SOLN
INTRAVENOUS | Status: DC
  Administered 2018-12-20: 1000 mL via INTRAVENOUS

## 2018-12-20 MED ORDER — PROPOFOL 500 MG/50ML IV EMUL
INTRAVENOUS | Status: AC
  Filled 2018-12-20: qty 100

## 2018-12-20 MED ORDER — LIDOCAINE 2% (20 MG/ML) 5 ML SYRINGE
INTRAMUSCULAR | Status: DC | PRN
  Administered 2018-12-20: 60 mg via INTRAVENOUS

## 2018-12-20 NOTE — Anesthesia Post-op Follow-up Note (Signed)
Anesthesia QCDR form completed.        

## 2018-12-20 NOTE — H&P (Signed)
Vonda Antigua, MD [address] [place], [place], [place], [address] [address], [place], [place], [place], [address] Phone: [phone]  Fax: [phone]  Primary Care Physician:  Jearld Fenton, NP   Pre-Procedure History & Physical: HPI:  Bridget Burns is a 58 y.o. female is here for a colonoscopy.   Past Medical History:  Diagnosis Date  . History of colon polyps     Past Surgical History:  Procedure Laterality Date  . ABDOMINAL HYSTERECTOMY  2015   total  . APPENDECTOMY    . WISDOM TOOTH EXTRACTION      Prior to Admission medications   Medication Sig Start Date End Date Taking? Authorizing Provider  Calcium Carb-Cholecalciferol (CALCIUM 600 + D PO) Take 1-2 tablets by mouth daily.   Yes [provider]  estradiol (ESTRACE) 0.1 MG/GM vaginal cream Place 1 Applicatorful vaginally 2 (two) times a week. 05/06/18  Yes Malachy Mood, MD  Omega-3 Fatty Acids (FISH OIL) 1000 MG CAPS Take 1 capsule by mouth daily.   Yes [provider]    Allergies as of 12/02/2018  . (No Known Allergies)    Family History  Problem Relation Age of Onset  . Hypertension Mother   . Cancer Father   . Breast cancer Neg Hx     Social History   Socioeconomic History  . Marital status: Single    Spouse name: Not on file  . Number of children: Not on file  . Years of education: Not on file  . Highest education level: Not on file  Occupational History  . Not on file  Social Needs  . Financial resource strain: Not on file  . Food insecurity:    Worry: Not on file    Inability: Not on file  . Transportation needs:    Medical: Not on file    Non-medical: Not on file  Tobacco Use  . Smoking status: Never Smoker  . Smokeless tobacco: Never Used  Substance and Sexual Activity  . Alcohol use: No  . Drug use: No  . Sexual activity: Yes  Lifestyle  . Physical activity:    Days per week: Not on file    Minutes per session: Not on file  . Stress:  Not on file  Relationships  . Social connections:    Talks on phone: Not on file    Gets together: Not on file    Attends religious service: Not on file    Active member of club or organization: Not on file    Attends meetings of clubs or organizations: Not on file    Relationship status: Not on file  . Intimate partner violence:    Fear of current or ex partner: Not on file    Emotionally abused: Not on file    Physically abused: Not on file    Forced sexual activity: Not on file  Other Topics Concern  . Not on file  Social History Narrative  . Not on file    Review of Systems: See HPI, otherwise negative ROS  Physical Exam: BP 134/72   Pulse 97   Temp (!) 96.9 F (36.1 C) (Tympanic)   Resp 20   Ht 5\' 6"  (1.676 m)   Wt 65.8 kg   LMP  (LMP Unknown) Comment: Hysterectomy  SpO2 99%   BMI 23.40 kg/m  General:   Alert,  pleasant and cooperative in NAD Head:  Normocephalic and atraumatic. Neck:  Supple; no masses or thyromegaly. Lungs:  Clear throughout  to auscultation, normal respiratory effort.    Heart:  +S1, +S2, Regular rate and rhythm, No edema. Abdomen:  Soft, nontender and nondistended. Normal bowel sounds, without guarding, and without rebound.   Neurologic:  Alert and  oriented x4;  grossly normal neurologically.  Impression/Plan: Bridget Burns is here for a colonoscopy to be performed for polyp surveillance. As per Dr. Georgianne Fick "Colonoscopy last 4 years ago in [place] with polyps noted and 5 year follow recommended (due January 2020 per patient)"  Risks, benefits, limitations, and alternatives regarding  colonoscopy have been reviewed with the patient.  Questions have been answered.  All parties agreeable.   Virgel Manifold, MD  12/20/2018, 8:50 AM

## 2018-12-20 NOTE — Transfer of Care (Signed)
Immediate Anesthesia Transfer of Care Note  Patient: Bridget Burns  Procedure(s) Performed: COLONOSCOPY WITH PROPOFOL (N/A )  Patient Location: PACU  Anesthesia Type:General  Level of Consciousness: sedated  Airway & Oxygen Therapy: Patient Spontanous Breathing  Post-op Assessment: Report given to RN and Post -op Vital signs reviewed and stable  Post vital signs: Reviewed and stable  Last Vitals:  Vitals Value Taken Time  BP 93/60 12/20/2018  9:40 AM  Temp 36.1 C 12/20/2018  9:39 AM  Pulse 72 12/20/2018  9:40 AM  Resp 14 12/20/2018  9:40 AM  SpO2 96 % 12/20/2018  9:40 AM  Vitals shown include unvalidated device data.  Last Pain:  Vitals:   12/20/18 0939  TempSrc: Tympanic  PainSc: 0-No pain         Complications: No apparent anesthesia complications

## 2018-12-20 NOTE — Anesthesia Postprocedure Evaluation (Signed)
Anesthesia Post Note  Patient: Bridget Burns  Procedure(s) Performed: COLONOSCOPY WITH PROPOFOL (N/A )  Patient location during evaluation: PACU Anesthesia Type: General Level of consciousness: awake and alert Pain management: pain level controlled Vital Signs Assessment: post-procedure vital signs reviewed and stable Respiratory status: spontaneous breathing, nonlabored ventilation and respiratory function stable Cardiovascular status: blood pressure returned to baseline and stable Postop Assessment: no apparent nausea or vomiting Anesthetic complications: no     Last Vitals:  Vitals:   12/20/18 0956 12/20/18 1001  BP: 102/62 102/81  Pulse: 65 67  Resp: (!) 9 13  Temp:    SpO2: 100% 100%    Last Pain:  Vitals:   12/20/18 1001  TempSrc:   PainSc: 0-No pain                 Durenda Hurt

## 2018-12-20 NOTE — Op Note (Signed)
[facility] Gastroenterology Patient Name: Bridget Burns Procedure Date: 12/20/2018 8:56 AM MRN: [id number] Account #: [id number] Date of Birth: Oct 09, 1961 Admit Type: Outpatient Age: 58 Room: [facility] ENDO ROOM 4 Gender: Female Note Status: Finalized Procedure:            Colonoscopy Indications:          High risk colon cancer surveillance: Personal history                        of colonic polyps Providers:            Zhoey Blackstock B. Bonna Gains MD, MD Referring MD:         Jearld Fenton (Referring MD) Medicines:            Monitored Anesthesia Care Complications:        No immediate complications. Procedure:            Pre-Anesthesia Assessment:                       - ASA Grade Assessment: II - A patient with mild                        systemic disease.                       - Prior to the procedure, a History and Physical was                        performed, and patient medications, allergies and                        sensitivities were reviewed. The patient's tolerance of                        previous anesthesia was reviewed.                       - The risks and benefits of the procedure and the                        sedation options and risks were discussed with the                        patient. All questions were answered and informed                        consent was obtained.                       - Patient identification and proposed procedure were                        verified prior to the procedure by the physician, the                        nurse, the anesthesiologist, the anesthetist and the                        technician. The procedure was verified in the procedure  room.                       After obtaining informed consent, the colonoscope was                        passed under direct vision. Throughout the procedure,                        the patient's blood pressure, pulse, and oxygen   saturations were monitored continuously. The                        Colonoscope was introduced through the anus and                        advanced to the the cecum, identified by appendiceal                        orifice and ileocecal valve. The colonoscopy was                        performed with ease. The patient tolerated the                        procedure well. The quality of the bowel preparation                        was good. Findings:      The perianal and digital rectal examinations were normal.      A 4 mm polyp was found in the ascending colon. The polyp was sessile.       The polyp was removed with a cold biopsy forceps. Resection and       retrieval were complete.      The exam was otherwise without abnormality.      The rectum, sigmoid colon, descending colon, transverse colon, ascending       colon and cecum appeared normal.      The retroflexed view of the distal rectum and anal verge was normal and       showed no anal or rectal abnormalities. Impression:           - One 4 mm polyp in the ascending colon, removed with a                        cold biopsy forceps. Resected and retrieved.                       - The examination was otherwise normal.                       - The rectum, sigmoid colon, descending colon,                        transverse colon, ascending colon and cecum are normal.                       - The distal rectum and anal verge are normal on                        retroflexion view. Recommendation:       -  Discharge patient to home (with escort).                       - Advance diet as tolerated.                       - Continue present medications.                       - Await pathology results.                       - Repeat colonoscopy in 5 years.                       - The findings and recommendations were discussed with                        the patient.                       - The findings and recommendations were discussed with                         the patient's family.                       - Return to primary care physician as previously                        scheduled. Procedure Code(s):    --- Professional ---                       [phone], Colonoscopy, flexible; with biopsy, single or                        multiple Diagnosis Code(s):    --- Professional ---                       Z86.010, Personal history of colonic polyps                       D12.2, Benign neoplasm of ascending colon CPT copyright 2018 American Medical Association. All rights reserved. The codes documented in this report are preliminary and upon coder review may  be revised to meet current compliance requirements.  Vonda Antigua, MD Margretta Sidle B. Bonna Gains MD, MD 12/20/2018 9:48:47 AM This report has been signed electronically. Number of Addenda: 0 Note Initiated On: 12/20/2018 8:56 AM Scope Withdrawal Time: 0 hours 13 minutes 11 seconds  Total Procedure Duration: 0 hours 26 minutes 46 seconds  Estimated Blood Loss: Estimated blood loss: none.      [facility]

## 2018-12-20 NOTE — Anesthesia Preprocedure Evaluation (Signed)
Anesthesia Evaluation  Patient identified by MRN, date of birth, ID band Patient awake    Reviewed: Allergy & Precautions, H&P , NPO status , Patient's Chart, lab work & pertinent test results  Airway Mallampati: I  TM Distance: >3 FB Neck ROM: full    Dental  (+) Teeth Intact   Pulmonary neg pulmonary ROS, neg shortness of breath, neg COPD, neg recent URI,           Cardiovascular (-) angina(-) Past MI and (-) Cardiac Stents negative cardio ROS  (-) dysrhythmias      Neuro/Psych negative neurological ROS  negative psych ROS   GI/Hepatic negative GI ROS, Neg liver ROS,   Endo/Other  negative endocrine ROS  Renal/GU negative Renal ROS  negative genitourinary   Musculoskeletal   Abdominal   Peds  Hematology negative hematology ROS (+)   Anesthesia Other Findings Past Medical History: No date: History of colon polyps  Past Surgical History: 2015: ABDOMINAL HYSTERECTOMY     Comment:  total No date: APPENDECTOMY No date: WISDOM TOOTH EXTRACTION  BMI    Body Mass Index:  23.40 kg/m      Reproductive/Obstetrics negative OB ROS                             Anesthesia Physical Anesthesia Plan  ASA: II  Anesthesia Plan: General   Post-op Pain Management:    Induction:   PONV Risk Score and Plan: Propofol infusion and TIVA  Airway Management Planned:   Additional Equipment:   Intra-op Plan:   Post-operative Plan:   Informed Consent: I have reviewed the patients History and Physical, chart, labs and discussed the procedure including the risks, benefits and alternatives for the proposed anesthesia with the patient or authorized representative who has indicated his/her understanding and acceptance.     Dental Advisory Given  Plan Discussed with: Anesthesiologist and CRNA  Anesthesia Plan Comments:         Anesthesia Quick Evaluation

## 2018-12-21 ENCOUNTER — Encounter: Payer: Self-pay | Admitting: Gastroenterology

## 2018-12-23 LAB — SURGICAL PATHOLOGY

## 2018-12-24 ENCOUNTER — Encounter: Payer: Self-pay | Admitting: Gastroenterology

## 2018-12-25 ENCOUNTER — Other Ambulatory Visit: Payer: Self-pay

## 2018-12-25 ENCOUNTER — Ambulatory Visit: Payer: BLUE CROSS/BLUE SHIELD | Admitting: Psychology

## 2018-12-25 DIAGNOSIS — F410 Panic disorder [episodic paroxysmal anxiety] without agoraphobia: Secondary | ICD-10-CM

## 2019-01-01 ENCOUNTER — Ambulatory Visit: Payer: BLUE CROSS/BLUE SHIELD | Admitting: Psychology

## 2019-01-08 ENCOUNTER — Ambulatory Visit: Payer: BLUE CROSS/BLUE SHIELD | Admitting: Psychology

## 2019-01-15 ENCOUNTER — Ambulatory Visit: Payer: BLUE CROSS/BLUE SHIELD | Admitting: Psychology

## 2019-01-22 ENCOUNTER — Ambulatory Visit: Payer: BLUE CROSS/BLUE SHIELD | Admitting: Psychology

## 2019-01-29 ENCOUNTER — Ambulatory Visit: Payer: BLUE CROSS/BLUE SHIELD | Admitting: Psychology

## 2019-02-05 ENCOUNTER — Ambulatory Visit: Payer: BLUE CROSS/BLUE SHIELD | Admitting: Psychology

## 2019-02-12 ENCOUNTER — Ambulatory Visit: Payer: BLUE CROSS/BLUE SHIELD | Admitting: Psychology

## 2019-02-19 ENCOUNTER — Ambulatory Visit: Payer: BLUE CROSS/BLUE SHIELD | Admitting: Psychology

## 2019-02-26 ENCOUNTER — Ambulatory Visit: Payer: BLUE CROSS/BLUE SHIELD | Admitting: Psychology

## 2019-03-05 ENCOUNTER — Ambulatory Visit: Payer: BLUE CROSS/BLUE SHIELD | Admitting: Psychology

## 2019-03-11 ENCOUNTER — Other Ambulatory Visit: Payer: Self-pay | Admitting: Obstetrics and Gynecology

## 2019-03-11 DIAGNOSIS — Z1231 Encounter for screening mammogram for malignant neoplasm of breast: Secondary | ICD-10-CM

## 2019-03-12 ENCOUNTER — Ambulatory Visit: Payer: BLUE CROSS/BLUE SHIELD | Admitting: Psychology

## 2019-03-19 ENCOUNTER — Ambulatory Visit: Payer: BLUE CROSS/BLUE SHIELD | Admitting: Psychology

## 2019-04-02 ENCOUNTER — Ambulatory Visit: Payer: BLUE CROSS/BLUE SHIELD | Admitting: Psychology

## 2019-04-09 ENCOUNTER — Ambulatory Visit: Payer: BLUE CROSS/BLUE SHIELD | Admitting: Psychology

## 2019-04-16 ENCOUNTER — Ambulatory Visit: Payer: BLUE CROSS/BLUE SHIELD | Admitting: Psychology

## 2019-04-23 ENCOUNTER — Ambulatory Visit: Payer: BLUE CROSS/BLUE SHIELD | Admitting: Psychology

## 2019-04-24 ENCOUNTER — Ambulatory Visit
Admission: RE | Admit: 2019-04-24 | Discharge: 2019-04-24 | Disposition: A | Discharge status: Home / Self Care | Payer: BC Managed Care – PPO | Source: Ambulatory Visit | Attending: Obstetrics and Gynecology | Admitting: Obstetrics and Gynecology

## 2019-04-24 ENCOUNTER — Other Ambulatory Visit: Payer: Self-pay

## 2019-04-24 DIAGNOSIS — Z1231 Encounter for screening mammogram for malignant neoplasm of breast: Secondary | ICD-10-CM | POA: Diagnosis not present

## 2019-04-30 ENCOUNTER — Ambulatory Visit: Payer: BLUE CROSS/BLUE SHIELD | Admitting: Psychology

## 2019-05-07 ENCOUNTER — Ambulatory Visit: Payer: BLUE CROSS/BLUE SHIELD | Admitting: Psychology

## 2019-05-14 ENCOUNTER — Ambulatory Visit: Payer: BLUE CROSS/BLUE SHIELD | Admitting: Psychology

## 2019-05-21 ENCOUNTER — Ambulatory Visit: Payer: BLUE CROSS/BLUE SHIELD | Admitting: Psychology

## 2019-05-28 ENCOUNTER — Ambulatory Visit: Payer: Self-pay | Admitting: Psychology

## 2019-10-07 ENCOUNTER — Encounter: Payer: Self-pay | Admitting: Podiatry

## 2019-10-07 ENCOUNTER — Ambulatory Visit: Payer: BC Managed Care – PPO | Admitting: Podiatry

## 2019-10-07 ENCOUNTER — Other Ambulatory Visit: Payer: Self-pay

## 2019-10-07 VITALS — BP 134/91 | HR 91

## 2019-10-07 DIAGNOSIS — B070 Plantar wart: Secondary | ICD-10-CM | POA: Diagnosis not present

## 2019-10-13 NOTE — Progress Notes (Signed)
   Subjective: 58 y.o. female presenting to the office today as a new patient with a chief complaint of a painful lesion noted to the plantar right heel and the plantar great toe that have been present for the past 2-3 months. She reports h/o plantar warts and believes they have recurred. Walking and applying pressure to the areas increases the pain. She has not done anything at home for treatment. Patient is here for further evaluation and treatment.    Past Medical History:  Diagnosis Date  . History of colon polyps      Objective:  Physical Exam General: Alert and oriented x3 in no acute distress  Dermatology: Hyperkeratotic lesion(s) present on the bilateral feet. Pain on palpation with a central nucleated core noted. Skin is warm, dry and supple bilateral lower extremities. Negative for open lesions or macerations.  Vascular: Palpable pedal pulses bilaterally. No edema or erythema noted. Capillary refill within normal limits.  Neurological: Epicritic and protective threshold grossly intact bilaterally.   Musculoskeletal Exam: Pain on palpation at the keratotic lesion(s) noted. Range of motion within normal limits bilateral. Muscle strength 5/5 in all groups bilateral.  Assessment: 1. 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 Patient evaluated 2. Excisional debridement of keratoic lesion(s) using a chisel blade was performed without incident. Cantharone applied.  3. Dressed area with light dressing. 4. Patient is to return to the clinic in 2 weeks.   Edrick Kins, DPM [facility]  Dr. Edrick Kins, [address]                                        [place], [place] [address]                Office [phone]  Fax [phone]

## 2019-10-21 ENCOUNTER — Ambulatory Visit: Payer: BC Managed Care – PPO | Admitting: Podiatry

## 2019-10-21 ENCOUNTER — Encounter: Payer: Self-pay | Admitting: Podiatry

## 2019-10-21 ENCOUNTER — Other Ambulatory Visit: Payer: Self-pay

## 2019-10-21 DIAGNOSIS — L989 Disorder of the skin and subcutaneous tissue, unspecified: Secondary | ICD-10-CM

## 2019-10-23 NOTE — Progress Notes (Signed)
   Subjective: 59 y.o. female presenting to the office today for follow up evaluation of porokeratosis of the bilateral feet. She states she is doing well and improving. She reports some continued soreness of the areas. Walking and bearing weight increases the pain. She has not done anything at home for treatment. Patient is here for further evaluation and treatment.    Past Medical History:  Diagnosis Date  . History of colon polyps      Objective:  Physical Exam General: Alert and oriented x3 in no acute distress  Dermatology: Hyperkeratotic lesion(s) present on the bilateral feet. Pain on palpation with a central nucleated core noted. Skin is warm, dry and supple bilateral lower extremities. Negative for open lesions or macerations.  Vascular: Palpable pedal pulses bilaterally. No edema or erythema noted. Capillary refill within normal limits.  Neurological: Epicritic and protective threshold grossly intact bilaterally.   Musculoskeletal Exam: Pain on palpation at the keratotic lesion(s) noted. Range of motion within normal limits bilateral. Muscle strength 5/5 in all groups bilateral.  Assessment: 1. 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 Patient evaluated 2. Excisional debridement of keratoic lesion(s) using a chisel blade was performed without incident. Salinocaine applied.  3. Dressed area with light dressing. 4. Recommended good shoe gear.  5. Patient is to return to the clinic as needed.   Edrick Kins, DPM [facility]  Dr. Edrick Kins, [address]                                        [place], [place] [address]                Office [phone]  Fax [phone]

## 2019-11-03 ENCOUNTER — Ambulatory Visit (INDEPENDENT_AMBULATORY_CARE_PROVIDER_SITE_OTHER): Payer: BC Managed Care – PPO | Admitting: Obstetrics and Gynecology

## 2019-11-03 ENCOUNTER — Other Ambulatory Visit: Payer: Self-pay

## 2019-11-03 ENCOUNTER — Encounter: Payer: Self-pay | Admitting: Obstetrics and Gynecology

## 2019-11-03 VITALS — BP 138/74 | HR 81 | Ht 66.0 in | Wt 156.0 lb

## 2019-11-03 DIAGNOSIS — Z13220 Encounter for screening for lipoid disorders: Secondary | ICD-10-CM | POA: Diagnosis not present

## 2019-11-03 DIAGNOSIS — Z01419 Encounter for gynecological examination (general) (routine) without abnormal findings: Secondary | ICD-10-CM

## 2019-11-03 DIAGNOSIS — Z1329 Encounter for screening for other suspected endocrine disorder: Secondary | ICD-10-CM | POA: Diagnosis not present

## 2019-11-03 DIAGNOSIS — Z131 Encounter for screening for diabetes mellitus: Secondary | ICD-10-CM | POA: Diagnosis not present

## 2019-11-03 DIAGNOSIS — Z1239 Encounter for other screening for malignant neoplasm of breast: Secondary | ICD-10-CM

## 2019-11-03 NOTE — Progress Notes (Signed)
Gynecology Annual Exam  PCP: Jearld Fenton, NP  Chief Complaint:  Chief Complaint  Patient presents with  . Gynecologic Exam    History of Present Illness:Patient is a 59 y.o. G0P0000 presents for annual exam. The patient has no complaints today.   LMP: No LMP recorded (lmp unknown). Patient has had a hysterectomy.  No gyn complaints.  Slightly worsening urinary urgency but not an issue as patient does home office.  The patient does perform self breast exams.  There is no notable family history of breast or ovarian cancer in her family.  The patient wears seatbelts: yes.   The patient has regular exercise: not asked.  Does report being slightly less active as with COVID and her dog having had ACL surgery  The patient denies current symptoms of depression.     Review of Systems: Review of Systems  Constitutional: Negative for chills and fever.  HENT: Negative for congestion.   Respiratory: Negative for cough and shortness of breath.   Cardiovascular: Negative for chest pain and palpitations.  Gastrointestinal: Negative for abdominal pain, constipation, diarrhea, heartburn, nausea and vomiting.  Genitourinary: Negative for dysuria, frequency and urgency.  Skin: Negative for itching and rash.  Neurological: Negative for dizziness and headaches.  Endo/Heme/Allergies: Negative for polydipsia.  Psychiatric/Behavioral: Negative for depression.    Past Medical History:  Past Medical History:  Diagnosis Date  . History of colon polyps     Past Surgical History:  Past Surgical History:  Procedure Laterality Date  . ABDOMINAL HYSTERECTOMY  2015   total  . APPENDECTOMY    . COLONOSCOPY WITH PROPOFOL N/A 12/20/2018   Procedure: COLONOSCOPY WITH PROPOFOL;  Surgeon: Virgel Manifold, MD;  Location: [facility] ENDOSCOPY;  Service: Endoscopy;  Laterality: N/A;  . WISDOM TOOTH EXTRACTION      Gynecologic History:  No LMP recorded (lmp unknown). Patient has had a  hysterectomy. Last Pap: Results were: N/A prior hysterectomy Last mammogram: 04/24/2019 Results were: BI-RAD I  Obstetric History: G0P0000  Family History:  Family History  Problem Relation Age of Onset  . Hypertension Mother   . Cancer Father   . Breast cancer Neg Hx     Social History:  Social History   Socioeconomic History  . Marital status: Single    Spouse name: Not on file  . Number of children: Not on file  . Years of education: Not on file  . Highest education level: Not on file  Occupational History  . Not on file  Tobacco Use  . Smoking status: Never Smoker  . Smokeless tobacco: Never Used  Substance and Sexual Activity  . Alcohol use: No  . Drug use: No  . Sexual activity: Yes  Other Topics Concern  . Not on file  Social History Narrative  . Not on file   Social Determinants of Health   Financial Resource Strain:   . Difficulty of Paying Living Expenses: Not on file  Food Insecurity:   . Worried About Charity fundraiser in the Last Year: Not on file  . Ran Out of Food in the Last Year: Not on file  Transportation Needs:   . Lack of Transportation (Medical): Not on file  . Lack of Transportation (Non-Medical): Not on file  Physical Activity:   . Days of Exercise per Week: Not on file  . Minutes of Exercise per Session: Not on file  Stress:   . Feeling of Stress : Not on file  Social Connections:   .  Frequency of Communication with Friends and Family: Not on file  . Frequency of Social Gatherings with Friends and Family: Not on file  . Attends Religious Services: Not on file  . Active Member of Clubs or Organizations: Not on file  . Attends Archivist Meetings: Not on file  . Marital Status: Not on file  Intimate Partner Violence:   . Fear of Current or Ex-Partner: Not on file  . Emotionally Abused: Not on file  . Physically Abused: Not on file  . Sexually Abused: Not on file    Allergies:  No Known Allergies  Medications: Prior  to Admission medications   Medication Sig Start Date End Date Taking? Authorizing Provider  Ascorbic Acid (VITAMIN C) 1000 MG tablet Take 1,000 mg by mouth daily.   Yes [provider]  Calcium Carb-Cholecalciferol (CALCIUM 600 + D PO) Take 1-2 tablets by mouth daily.   Yes [provider]  cholecalciferol (VITAMIN D3) 25 MCG (1000 UNIT) tablet Take 1,000 Units by mouth daily.   Yes [provider]  estradiol (ESTRACE) 0.1 MG/GM vaginal cream Place 1 Applicatorful vaginally 2 (two) times a week. 05/06/18  Yes Malachy Mood, MD  Omega-3 Fatty Acids (FISH OIL) 1000 MG CAPS Take 1 capsule by mouth daily.   Yes [provider]  zinc gluconate 50 MG tablet Take 50 mg by mouth daily.   Yes [provider]    Physical Exam Vitals: Blood pressure 138/74, pulse 81, height 5\' 6"  (1.676 m), weight 156 lb (70.8 kg).  General: NAD HEENT: normocephalic, anicteric Thyroid: no enlargement, no palpable nodules Pulmonary: No increased work of breathing, CTAB Cardiovascular: RRR, distal pulses 2+ Breast: Breast symmetrical, no tenderness, no palpable nodules or masses, no skin or nipple retraction present, no nipple discharge.  No axillary or supraclavicular lymphadenopathy. Abdomen: NABS, soft, non-tender, non-distended.  Umbilicus without lesions.  No hepatomegaly, splenomegaly or masses palpable. No evidence of hernia  Genitourinary:  External: Normal external female genitalia.  Normal urethral meatus, normal Bartholin's and Skene's glands.    Vagina: Normal vaginal mucosa, no evidence of prolapse.    Cervix: surgically absent  Uterus: surgically absent  Adnexa: ovaries non-enlarged, no adnexal masses  Rectal: deferred  Lymphatic: no evidence of inguinal lymphadenopathy Extremities: no edema, erythema, or tenderness.  Small dermatofibroma left lateral thigh Neurologic: Grossly intact Psychiatric: mood appropriate, affect full  Female chaperone present  for pelvic and breast  portions of the physical exam     Assessment: 59 y.o. G0P0000 routine annual exam  Plan: Problem List Items Addressed This Visit    None    Visit Diagnoses    Encounter for gynecological examination without abnormal finding    -  Primary   Relevant Orders   CMP14+LP+TP+TSH+CBC/Plt   Breast screening       Screening for diabetes mellitus       Relevant Orders   CMP14+LP+TP+TSH+CBC/Plt   Lipid screening       Relevant Orders   CMP14+LP+TP+TSH+CBC/Plt   Thyroid disorder screening       Relevant Orders   CMP14+LP+TP+TSH+CBC/Plt      1) Mammogram - recommend yearly screening mammogram.  Mammogram Is up to date  2) STI screening  was notoffered and therefore not obtained  3) ASCCP guidelines and rational discussed.  Patient opts for discontinue secondary to prior hysterectomy screening interval  4) Osteoporosis  - per USPTF routine screening DEXA at age 61  5) Routine healthcare maintenance including cholesterol, diabetes screening discussed  Ordered today  - non-fasting had toast, coco  6) Colonoscopy - March 2020   7) Return in about 1 year (around 11/02/2020) for annual.    Malachy Mood, MD Mosetta Pigeon, [facility] 11/03/2019, 10:39 AM

## 2019-11-04 LAB — CMP14+LP+TP+TSH+CBC/PLT
ALT: 28 IU/L (ref 0–32)
AST: 26 IU/L (ref 0–40)
Albumin/Globulin Ratio: 1.7 (ref 1.2–2.2)
Albumin: 4.5 g/dL (ref 3.8–4.9)
Alkaline Phosphatase: 112 IU/L (ref 39–117)
BUN/Creatinine Ratio: 23 (ref 9–23)
BUN: 17 mg/dL (ref 6–24)
Bilirubin Total: 1 mg/dL (ref 0.0–1.2)
CO2: 26 mmol/L (ref 20–29)
Calcium: 10.4 mg/dL — ABNORMAL HIGH (ref 8.7–10.2)
Chloride: 102 mmol/L (ref 96–106)
Cholesterol, Total: 228 mg/dL — ABNORMAL HIGH (ref 100–199)
Creatinine, Ser: 0.73 mg/dL (ref 0.57–1.00)
Free Thyroxine Index: 1.6 (ref 1.2–4.9)
GFR calc Af Amer: 104 mL/min/{1.73_m2} (ref >59)
GFR calc non Af Amer: 90 mL/min/{1.73_m2} (ref >59)
Globulin, Total: 2.7 g/dL (ref 1.5–4.5)
Glucose: 78 mg/dL (ref 65–99)
HDL: 38 mg/dL — ABNORMAL LOW (ref >39)
Hematocrit: 43.9 % (ref 34.0–46.6)
Hemoglobin: 15 g/dL (ref 11.1–15.9)
LDL Chol Calc (NIH): 152 mg/dL — ABNORMAL HIGH (ref 0–99)
LDL/HDL Ratio: 4 ratio — ABNORMAL HIGH (ref 0.0–3.2)
MCH: 30.9 pg (ref 26.6–33.0)
MCHC: 34.2 g/dL (ref 31.5–35.7)
MCV: 90 fL (ref 79–97)
Platelets: 377 10*3/uL (ref 150–450)
Potassium: 4.1 mmol/L (ref 3.5–5.2)
RBC: 4.86 x10E6/uL (ref 3.77–5.28)
RDW: 12.6 % (ref 11.7–15.4)
Sodium: 141 mmol/L (ref 134–144)
T3 Uptake Ratio: 25 % (ref 24–39)
T4, Total: 6.2 ug/dL (ref 4.5–12.0)
TSH: 2.88 u[IU]/mL (ref 0.450–4.500)
Total Protein: 7.2 g/dL (ref 6.0–8.5)
Triglycerides: 205 mg/dL — ABNORMAL HIGH (ref 0–149)
VLDL Cholesterol Cal: 38 mg/dL (ref 5–40)
WBC: 7.2 10*3/uL (ref 3.4–10.8)

## 2020-01-19 ENCOUNTER — Ambulatory Visit: Payer: BC Managed Care – PPO | Attending: Internal Medicine

## 2020-01-19 ENCOUNTER — Ambulatory Visit: Payer: BC Managed Care – PPO

## 2020-01-19 ENCOUNTER — Other Ambulatory Visit: Payer: Self-pay

## 2020-01-19 DIAGNOSIS — Z23 Encounter for immunization: Secondary | ICD-10-CM

## 2020-01-19 NOTE — Progress Notes (Signed)
   Covid-19 Vaccination Clinic  Name:  Bridget Burns    MRN: [id number] DOB: Apr 05, 1961  01/19/2020  [name] was observed post Covid-19 immunization for 15 minutes without incident. She was provided with Vaccine Information Sheet and instruction to access the V-Safe system.   [name] was instructed to call 911 with any severe reactions post vaccine: Marland Kitchen Difficulty breathing  . Swelling of face and throat  . A fast heartbeat  . A bad rash all over body  . Dizziness and weakness   Immunizations Administered    Name Date Dose VIS Date Route   Pfizer COVID-19 Vaccine 01/19/2020  9:24 AM 0.3 mL 09/26/2019 Intramuscular   Manufacturer: [place]   Lot: [id number]   [place]: [id number]

## 2020-02-17 ENCOUNTER — Ambulatory Visit: Payer: BC Managed Care – PPO | Attending: Internal Medicine

## 2020-02-17 DIAGNOSIS — Z23 Encounter for immunization: Secondary | ICD-10-CM

## 2020-02-17 NOTE — Progress Notes (Signed)
   Covid-19 Vaccination Clinic  Name:  Bridget Burns    MRN: [id number] DOB: 1961-01-13  02/17/2020  [name] was observed post Covid-19 immunization for 15 minutes without incident. She was provided with Vaccine Information Sheet and instruction to access the V-Safe system.   [name] was instructed to call 911 with any severe reactions post vaccine: Marland Kitchen Difficulty breathing  . Swelling of face and throat  . A fast heartbeat  . A bad rash all over body  . Dizziness and weakness   Immunizations Administered    Name Date Dose VIS Date Route   Pfizer COVID-19 Vaccine 02/17/2020  8:15 AM 0.3 mL 12/10/2018 Intramuscular   Manufacturer: [place]   Lot: [id number]   [place]: [id number]

## 2020-03-03 DIAGNOSIS — S52591A Other fractures of lower end of right radius, initial encounter for closed fracture: Secondary | ICD-10-CM | POA: Diagnosis not present

## 2020-03-04 DIAGNOSIS — S52501A Unspecified fracture of the lower end of right radius, initial encounter for closed fracture: Secondary | ICD-10-CM | POA: Diagnosis not present

## 2020-03-05 DIAGNOSIS — G8918 Other acute postprocedural pain: Secondary | ICD-10-CM | POA: Diagnosis not present

## 2020-03-05 DIAGNOSIS — W5512XA Struck by horse, initial encounter: Secondary | ICD-10-CM | POA: Diagnosis not present

## 2020-03-05 DIAGNOSIS — M25531 Pain in right wrist: Secondary | ICD-10-CM | POA: Diagnosis not present

## 2020-03-05 DIAGNOSIS — Z20822 Contact with and (suspected) exposure to covid-19: Secondary | ICD-10-CM | POA: Diagnosis not present

## 2020-03-05 DIAGNOSIS — Y9389 Activity, other specified: Secondary | ICD-10-CM | POA: Diagnosis not present

## 2020-03-05 DIAGNOSIS — S52571A Other intraarticular fracture of lower end of right radius, initial encounter for closed fracture: Secondary | ICD-10-CM | POA: Diagnosis not present

## 2020-03-05 DIAGNOSIS — W1830XA Fall on same level, unspecified, initial encounter: Secondary | ICD-10-CM | POA: Diagnosis not present

## 2020-03-11 DIAGNOSIS — S52501D Unspecified fracture of the lower end of right radius, subsequent encounter for closed fracture with routine healing: Secondary | ICD-10-CM | POA: Diagnosis not present

## 2020-03-18 DIAGNOSIS — Z09 Encounter for follow-up examination after completed treatment for conditions other than malignant neoplasm: Secondary | ICD-10-CM | POA: Diagnosis not present

## 2020-03-18 DIAGNOSIS — S52501D Unspecified fracture of the lower end of right radius, subsequent encounter for closed fracture with routine healing: Secondary | ICD-10-CM | POA: Diagnosis not present

## 2020-03-23 ENCOUNTER — Other Ambulatory Visit: Payer: Self-pay | Admitting: Obstetrics and Gynecology

## 2020-03-23 DIAGNOSIS — Z1231 Encounter for screening mammogram for malignant neoplasm of breast: Secondary | ICD-10-CM

## 2020-03-25 DIAGNOSIS — S52501D Unspecified fracture of the lower end of right radius, subsequent encounter for closed fracture with routine healing: Secondary | ICD-10-CM | POA: Diagnosis not present

## 2020-04-02 DIAGNOSIS — S52501D Unspecified fracture of the lower end of right radius, subsequent encounter for closed fracture with routine healing: Secondary | ICD-10-CM | POA: Diagnosis not present

## 2020-04-08 DIAGNOSIS — S52501D Unspecified fracture of the lower end of right radius, subsequent encounter for closed fracture with routine healing: Secondary | ICD-10-CM | POA: Diagnosis not present

## 2020-04-15 DIAGNOSIS — S52501D Unspecified fracture of the lower end of right radius, subsequent encounter for closed fracture with routine healing: Secondary | ICD-10-CM | POA: Diagnosis not present

## 2020-04-22 DIAGNOSIS — S52501D Unspecified fracture of the lower end of right radius, subsequent encounter for closed fracture with routine healing: Secondary | ICD-10-CM | POA: Diagnosis not present

## 2020-04-26 ENCOUNTER — Ambulatory Visit
Admission: RE | Admit: 2020-04-26 | Discharge: 2020-04-26 | Disposition: A | Discharge status: Home / Self Care | Payer: BC Managed Care – PPO | Source: Ambulatory Visit | Attending: Obstetrics and Gynecology | Admitting: Obstetrics and Gynecology

## 2020-04-26 DIAGNOSIS — Z1231 Encounter for screening mammogram for malignant neoplasm of breast: Secondary | ICD-10-CM | POA: Diagnosis not present

## 2020-04-27 DIAGNOSIS — S52501D Unspecified fracture of the lower end of right radius, subsequent encounter for closed fracture with routine healing: Secondary | ICD-10-CM | POA: Diagnosis not present

## 2020-04-30 DIAGNOSIS — S52501D Unspecified fracture of the lower end of right radius, subsequent encounter for closed fracture with routine healing: Secondary | ICD-10-CM | POA: Diagnosis not present

## 2020-05-04 DIAGNOSIS — S52501D Unspecified fracture of the lower end of right radius, subsequent encounter for closed fracture with routine healing: Secondary | ICD-10-CM | POA: Diagnosis not present

## 2020-05-07 DIAGNOSIS — S52501D Unspecified fracture of the lower end of right radius, subsequent encounter for closed fracture with routine healing: Secondary | ICD-10-CM | POA: Diagnosis not present

## 2020-05-14 DIAGNOSIS — S52501D Unspecified fracture of the lower end of right radius, subsequent encounter for closed fracture with routine healing: Secondary | ICD-10-CM | POA: Diagnosis not present

## 2020-05-20 DIAGNOSIS — S52501D Unspecified fracture of the lower end of right radius, subsequent encounter for closed fracture with routine healing: Secondary | ICD-10-CM | POA: Diagnosis not present

## 2020-05-27 DIAGNOSIS — S52501D Unspecified fracture of the lower end of right radius, subsequent encounter for closed fracture with routine healing: Secondary | ICD-10-CM | POA: Diagnosis not present

## 2020-06-03 DIAGNOSIS — S52501D Unspecified fracture of the lower end of right radius, subsequent encounter for closed fracture with routine healing: Secondary | ICD-10-CM | POA: Diagnosis not present

## 2020-06-11 DIAGNOSIS — S52501D Unspecified fracture of the lower end of right radius, subsequent encounter for closed fracture with routine healing: Secondary | ICD-10-CM | POA: Diagnosis not present

## 2020-06-24 DIAGNOSIS — S52501D Unspecified fracture of the lower end of right radius, subsequent encounter for closed fracture with routine healing: Secondary | ICD-10-CM | POA: Diagnosis not present

## 2020-06-29 DIAGNOSIS — S52501D Unspecified fracture of the lower end of right radius, subsequent encounter for closed fracture with routine healing: Secondary | ICD-10-CM | POA: Diagnosis not present

## 2020-07-02 ENCOUNTER — Ambulatory Visit: Payer: BC Managed Care – PPO | Admitting: Podiatry

## 2020-07-02 ENCOUNTER — Other Ambulatory Visit: Payer: Self-pay

## 2020-07-02 ENCOUNTER — Encounter: Payer: Self-pay | Admitting: Podiatry

## 2020-07-02 DIAGNOSIS — L989 Disorder of the skin and subcutaneous tissue, unspecified: Secondary | ICD-10-CM

## 2020-07-08 DIAGNOSIS — S52501D Unspecified fracture of the lower end of right radius, subsequent encounter for closed fracture with routine healing: Secondary | ICD-10-CM | POA: Diagnosis not present

## 2020-07-09 NOTE — Progress Notes (Signed)
   Subjective: 59 y.o. female presenting to the office today for follow up evaluation of porokeratosis of the bilateral feet.  Patient states that she is feeling much better with the debridement.  She has noticed about 2 weeks ago that the calluses began to return.  She was last seen here in the office on 10/21/2019.  She notes pain with walking and weightbearing despite different shoes.  She presents for further treatment and evaluation   Past Medical History:  Diagnosis Date  . History of colon polyps      Objective:  Physical Exam General: Alert and oriented x3 in no acute distress  Dermatology: Hyperkeratotic lesion(s) present on the bilateral feet. Pain on palpation with a central nucleated core noted. Skin is warm, dry and supple bilateral lower extremities. Negative for open lesions or macerations.  Vascular: Palpable pedal pulses bilaterally. No edema or erythema noted. Capillary refill within normal limits.  Neurological: Epicritic and protective threshold grossly intact bilaterally.   Musculoskeletal Exam: Pain on palpation at the keratotic lesion(s) noted. Range of motion within normal limits bilateral. Muscle strength 5/5 in all groups bilateral.  Assessment: 1. 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 Patient evaluated 2. Excisional debridement of keratoic lesion(s) using a chisel blade was performed without incident. Salinocaine applied.  3. Dressed area with light dressing. 4. Recommended good shoe gear.  5. Salinocaine provided to apply daily. 6. Patient is to return to the clinic as needed.   *Has horses.  Broke her right wrist when she was kicked by a horse.  Wearing a cast today  Edrick Kins, DPM [facility]  Dr. Edrick Kins, [address]                                        [place], [place] [address]                Office [phone]  Fax [phone]

## 2020-07-13 DIAGNOSIS — S52501D Unspecified fracture of the lower end of right radius, subsequent encounter for closed fracture with routine healing: Secondary | ICD-10-CM | POA: Diagnosis not present

## 2020-07-23 DIAGNOSIS — S52501D Unspecified fracture of the lower end of right radius, subsequent encounter for closed fracture with routine healing: Secondary | ICD-10-CM | POA: Diagnosis not present

## 2020-08-12 DIAGNOSIS — S52501D Unspecified fracture of the lower end of right radius, subsequent encounter for closed fracture with routine healing: Secondary | ICD-10-CM | POA: Diagnosis not present

## 2020-08-19 DIAGNOSIS — S52501D Unspecified fracture of the lower end of right radius, subsequent encounter for closed fracture with routine healing: Secondary | ICD-10-CM | POA: Diagnosis not present

## 2020-08-26 DIAGNOSIS — S52501D Unspecified fracture of the lower end of right radius, subsequent encounter for closed fracture with routine healing: Secondary | ICD-10-CM | POA: Diagnosis not present

## 2020-11-08 ENCOUNTER — Ambulatory Visit: Payer: BC Managed Care – PPO | Admitting: Obstetrics and Gynecology

## 2020-11-19 ENCOUNTER — Encounter: Payer: Self-pay | Admitting: Obstetrics and Gynecology

## 2020-11-19 ENCOUNTER — Ambulatory Visit (INDEPENDENT_AMBULATORY_CARE_PROVIDER_SITE_OTHER): Payer: BC Managed Care – PPO | Admitting: Obstetrics and Gynecology

## 2020-11-19 ENCOUNTER — Other Ambulatory Visit: Payer: Self-pay

## 2020-11-19 VITALS — BP 124/78 | Ht 66.0 in | Wt 145.0 lb

## 2020-11-19 DIAGNOSIS — Z0000 Encounter for general adult medical examination without abnormal findings: Secondary | ICD-10-CM | POA: Diagnosis not present

## 2020-11-19 DIAGNOSIS — Z01419 Encounter for gynecological examination (general) (routine) without abnormal findings: Secondary | ICD-10-CM | POA: Diagnosis not present

## 2020-11-19 DIAGNOSIS — Z1231 Encounter for screening mammogram for malignant neoplasm of breast: Secondary | ICD-10-CM

## 2020-11-19 DIAGNOSIS — Z131 Encounter for screening for diabetes mellitus: Secondary | ICD-10-CM

## 2020-11-19 DIAGNOSIS — Z79890 Hormone replacement therapy (postmenopausal): Secondary | ICD-10-CM

## 2020-11-19 DIAGNOSIS — Z23 Encounter for immunization: Secondary | ICD-10-CM | POA: Diagnosis not present

## 2020-11-19 DIAGNOSIS — E781 Pure hyperglyceridemia: Secondary | ICD-10-CM | POA: Diagnosis not present

## 2020-11-19 DIAGNOSIS — R030 Elevated blood-pressure reading, without diagnosis of hypertension: Secondary | ICD-10-CM | POA: Insufficient documentation

## 2020-11-19 DIAGNOSIS — Z13220 Encounter for screening for lipoid disorders: Secondary | ICD-10-CM

## 2020-11-19 MED ORDER — ESTRADIOL 0.1 MG/GM VA CREA: 1.0000 | TOPICAL_CREAM | VAGINAL | 2 refills | Status: AC

## 2020-11-19 NOTE — Progress Notes (Signed)
Gynecology Annual Exam  PCP: Jearld Fenton, NP  Chief Complaint:  Chief Complaint  Patient presents with  . Gynecologic Exam    Annual - no concerns. RM 5    History of Present Illness:Patient is a 60 y.o. G0P0000 presents for annual exam. The patient has no complaints today.   LMP: No LMP recorded (lmp unknown). Patient has had a hysterectomy.  The patient is sexually active. She admits to dyspareunia.  The patient does perform self breast exams.  There is no notable family history of breast or ovarian cancer in her family.  The patient wears seatbelts: yes.   The patient has regular exercise: not asked.    The patient denies current symptoms of depression.     Review of Systems: Review of Systems  Constitutional: Negative for chills and fever.  HENT: Negative for congestion.   Respiratory: Negative for cough and shortness of breath.   Cardiovascular: Negative for chest pain and palpitations.  Gastrointestinal: Negative for abdominal pain, constipation, diarrhea, heartburn, nausea and vomiting.  Genitourinary: Negative for dysuria, frequency and urgency.  Musculoskeletal: Positive for back pain and joint pain.  Skin: Negative for itching and rash.  Neurological: Negative for dizziness and headaches.  Endo/Heme/Allergies: Negative for polydipsia.  Psychiatric/Behavioral: Negative for depression.    Past Medical History:  Patient Active Problem List   Diagnosis Date Noted  . History of colonic polyps   . Benign neoplasm of ascending colon     Past Surgical History:  Past Surgical History:  Procedure Laterality Date  . ABDOMINAL HYSTERECTOMY  2015   total  . APPENDECTOMY    . COLONOSCOPY WITH PROPOFOL N/A 12/20/2018   Procedure: COLONOSCOPY WITH PROPOFOL;  Surgeon: Virgel Manifold, MD;  Location: [facility] ENDOSCOPY;  Service: Endoscopy;  Laterality: N/A;  . WISDOM TOOTH EXTRACTION      Gynecologic History:  No LMP recorded (lmp unknown). Patient has had a  hysterectomy. Last Pap: Results were: N/A s/p hysterectomy Last mammogram: 04/26/2020 Results were: BI-RAD I  Obstetric History: G0P0000  Family History:  Family History  Problem Relation Age of Onset  . Hypertension Mother   . Cancer Father   . Breast cancer Neg Hx     Social History:  Social History   Socioeconomic History  . Marital status: Single    Spouse name: Not on file  . Number of children: Not on file  . Years of education: Not on file  . Highest education level: Not on file  Occupational History  . Not on file  Tobacco Use  . Smoking status: Never Smoker  . Smokeless tobacco: Never Used  Vaping Use  . Vaping Use: Never used  Substance and Sexual Activity  . Alcohol use: No  . Drug use: No  . Sexual activity: Yes  Other Topics Concern  . Not on file  Social History Narrative  . Not on file   Social Determinants of Health   Financial Resource Strain: Not on file  Food Insecurity: Not on file  Transportation Needs: Not on file  Physical Activity: Not on file  Stress: Not on file  Social Connections: Not on file  Intimate Partner Violence: Not on file    Allergies:  No Known Allergies  Medications: Prior to Admission medications   Medication Sig Start Date End Date Taking? Authorizing Provider  Ascorbic Acid (VITAMIN C) 1000 MG tablet Take 1,000 mg by mouth daily.   Yes [provider]  Calcium Carb-Cholecalciferol (CALCIUM 600 +  D PO) Take 1-2 tablets by mouth daily.   Yes [provider]  cholecalciferol (VITAMIN D3) 25 MCG (1000 UNIT) tablet Take 1,000 Units by mouth daily.   Yes [provider]  estradiol (ESTRACE) 0.1 MG/GM vaginal cream Place 1 Applicatorful vaginally 2 (two) times a week. 05/06/18  Yes Vena Austria, MD  Omega-3 Fatty Acids (FISH OIL) 1000 MG CAPS Take 1 capsule by mouth daily.   Yes [provider]  zinc gluconate 50 MG tablet Take 50 mg by mouth daily.   Yes [provider]     Physical Exam Vitals: Blood pressure 124/78, height 5\' 6"  (1.676 m), weight 145 lb (65.8 kg). Body mass index is 23.4 kg/m.  General: NAD HEENT: normocephalic, anicteric Thyroid: no enlargement, no palpable nodules Pulmonary: No increased work of breathing, CTAB Cardiovascular: RRR, distal pulses 2+ Breast: Breast symmetrical, no tenderness, no palpable nodules or masses, no skin or nipple retraction present, no nipple discharge.  No axillary or supraclavicular lymphadenopathy. Abdomen: NABS, soft, non-tender, non-distended.  Umbilicus without lesions.  No hepatomegaly, splenomegaly or masses palpable. No evidence of hernia  Genitourinary:  External: Normal external female genitalia.  Normal urethral meatus, normal Bartholin's and Skene's glands.    Vagina: Normal vaginal mucosa, no evidence of prolapse.    Cervix: Grossly normal in appearance, no bleeding  Uterus: Non-enlarged, mobile, normal contour.  No CMT  Adnexa: ovaries non-enlarged, no adnexal masses  Rectal: deferred  Lymphatic: no evidence of inguinal lymphadenopathy Extremities: no edema, erythema, or tenderness Neurologic: Grossly intact Psychiatric: mood appropriate, affect full  Female chaperone present for pelvic and breast  portions of the physical exam  Immunization History  Administered Date(s) Administered  . Influenza,inj,Quad PF,6+ Mos 11/19/2020  . PFIZER(Purple Top)SARS-COV-2 Vaccination 01/19/2020, 02/17/2020  . Tdap 06/26/2016     Assessment: 60 y.o. G0P0000 routine annual exam  Plan: Problem List Items Addressed This Visit   None   Visit Diagnoses    Need for immunization against influenza    -  Primary   Relevant Orders   Flu Vaccine QUAD 36+ mos IM (Completed)   Encounter for gynecological examination without abnormal finding       Hypertriglyceridemia without hypercholesterolemia       Relevant Orders   Lipid panel   Comprehensive metabolic panel   Lipid screening       Relevant  Orders   Lipid panel   Laboratory examination ordered as part of a routine general medical examination       Relevant Orders   Lipid panel   Comprehensive metabolic panel   Screening for diabetes mellitus       Relevant Orders   Comprehensive metabolic panel   Breast cancer screening by mammogram       Relevant Orders   MM 3D SCREEN BREAST BILATERAL   Hormone replacement therapy, postmenopausal       Relevant Orders   Comprehensive metabolic panel      1) Mammogram - recommend yearly screening mammogram.  Mammogram Is up to date  2) STI screening  was notoffered and therefore not obtained  3) ASCCP guidelines and rational discussed.  Patient opts for discontinue secondary to prior hysterectomy   4) Osteoporosis  - per USPTF routine screening DEXA at age 15  5) Routine healthcare maintenance including cholesterol, diabetes screening discussed Ordered today  6) Colonoscopy - UTD 12/2018  7) Return in about 1 year (around 11/19/2021) for annual.    01/17/2022, MD Vena Austria, [facility] 11/19/2020, 9:54 AM

## 2020-11-19 NOTE — Patient Instructions (Signed)
[facility] [address]  [place] [address]  [facility]  [address] [place]  [address]  Phone: [phone]  

## 2020-11-20 LAB — LIPID PANEL
Chol/HDL Ratio: 6.8 ratio — ABNORMAL HIGH (ref 0.0–4.4)
Cholesterol, Total: 265 mg/dL — ABNORMAL HIGH (ref 100–199)
HDL: 39 mg/dL — ABNORMAL LOW (ref >39)
LDL Chol Calc (NIH): 197 mg/dL — ABNORMAL HIGH (ref 0–99)
Triglycerides: 156 mg/dL — ABNORMAL HIGH (ref 0–149)
VLDL Cholesterol Cal: 29 mg/dL (ref 5–40)

## 2020-11-20 LAB — COMPREHENSIVE METABOLIC PANEL
ALT: 31 IU/L (ref 0–32)
AST: 28 IU/L (ref 0–40)
Albumin/Globulin Ratio: 1.7 (ref 1.2–2.2)
Albumin: 4.5 g/dL (ref 3.8–4.9)
Alkaline Phosphatase: 130 IU/L — ABNORMAL HIGH (ref 44–121)
BUN/Creatinine Ratio: 28 (ref 12–28)
BUN: 19 mg/dL (ref 8–27)
Bilirubin Total: 1.1 mg/dL (ref 0.0–1.2)
CO2: 25 mmol/L (ref 20–29)
Calcium: 9.9 mg/dL (ref 8.7–10.3)
Chloride: 103 mmol/L (ref 96–106)
Creatinine, Ser: 0.68 mg/dL (ref 0.57–1.00)
GFR calc Af Amer: 110 mL/min/{1.73_m2} (ref >59)
GFR calc non Af Amer: 95 mL/min/{1.73_m2} (ref >59)
Globulin, Total: 2.7 g/dL (ref 1.5–4.5)
Glucose: 92 mg/dL (ref 65–99)
Potassium: 4 mmol/L (ref 3.5–5.2)
Sodium: 141 mmol/L (ref 134–144)
Total Protein: 7.2 g/dL (ref 6.0–8.5)

## 2021-04-28 ENCOUNTER — Other Ambulatory Visit: Payer: Self-pay

## 2021-04-28 ENCOUNTER — Ambulatory Visit
Admission: RE | Admit: 2021-04-28 | Discharge: 2021-04-28 | Disposition: A | Discharge status: Home / Self Care | Payer: BC Managed Care – PPO | Source: Ambulatory Visit | Attending: Obstetrics and Gynecology | Admitting: Obstetrics and Gynecology

## 2021-04-28 DIAGNOSIS — Z1231 Encounter for screening mammogram for malignant neoplasm of breast: Secondary | ICD-10-CM | POA: Diagnosis not present

## 2021-05-12 ENCOUNTER — Other Ambulatory Visit: Payer: Self-pay

## 2021-05-12 ENCOUNTER — Encounter: Payer: Self-pay | Admitting: Internal Medicine

## 2021-05-12 ENCOUNTER — Ambulatory Visit (INDEPENDENT_AMBULATORY_CARE_PROVIDER_SITE_OTHER): Payer: BC Managed Care – PPO | Admitting: Internal Medicine

## 2021-05-12 VITALS — BP 129/69 | HR 67 | Temp 97.3°F | Resp 17 | Ht 66.0 in | Wt 153.2 lb

## 2021-05-12 DIAGNOSIS — E785 Hyperlipidemia, unspecified: Secondary | ICD-10-CM | POA: Insufficient documentation

## 2021-05-12 DIAGNOSIS — E782 Mixed hyperlipidemia: Secondary | ICD-10-CM

## 2021-05-12 DIAGNOSIS — M25531 Pain in right wrist: Secondary | ICD-10-CM | POA: Diagnosis not present

## 2021-05-12 DIAGNOSIS — G8929 Other chronic pain: Secondary | ICD-10-CM

## 2021-05-12 NOTE — Patient Instructions (Signed)
Heart-Healthy Eating Plan Heart-healthy meal planning includes: Eating less unhealthy fats. Eating more healthy fats. Making other changes in your diet. Talk with your doctor or a diet specialist (dietitian) to create an eating plan that is right for you. What is my plan? Your doctor may recommend an eating plan that includes: Total fat: ______% or less of total calories a day. Saturated fat: ______% or less of total calories a day. Cholesterol: less than _________mg a day. What are tips for following this plan? Cooking Avoid frying your food. Try to bake, boil, grill, or broil it instead. You can also reduce fat by: Removing the skin from poultry. Removing all visible fats from meats. Steaming vegetables in water or broth. Meal planning  At meals, divide your plate into four equal parts: Fill one-half of your plate with vegetables and green salads. Fill one-fourth of your plate with whole grains. Fill one-fourth of your plate with lean protein foods. Eat 4-5 servings of vegetables per day. A serving of vegetables is: 1 cup of raw or cooked vegetables. 2 cups of raw leafy greens. Eat 4-5 servings of fruit per day. A serving of fruit is: 1 medium whole fruit.  cup of dried fruit.  cup of fresh, frozen, or canned fruit.  cup of 100% fruit juice. Eat more foods that have soluble fiber. These are apples, broccoli, carrots, beans, peas, and barley. Try to get 20-30 g of fiber per day. Eat 4-5 servings of nuts, legumes, and seeds per week: 1 serving of dried beans or legumes equals  cup after being cooked. 1 serving of nuts is  cup. 1 serving of seeds equals 1 tablespoon.  General information Eat more home-cooked food. Eat less restaurant, buffet, and fast food. Limit or avoid alcohol. Limit foods that are high in starch and sugar. Avoid fried foods. Lose weight if you are overweight. Keep track of how much salt (sodium) you eat. This is important if you have high blood  pressure. Ask your doctor to tell you more about this. Try to add vegetarian meals each week. Fats Choose healthy fats. These include olive oil and canola oil, flaxseeds, walnuts, almonds, and seeds. Eat more omega-3 fats. These include salmon, mackerel, sardines, tuna, flaxseed oil, and ground flaxseeds. Try to eat fish at least 2 times each week. Check food labels. Avoid foods with trans fats or high amounts of saturated fat. Limit saturated fats. These are often found in animal products, such as meats, butter, and cream. These are also found in plant foods, such as palm oil, palm kernel oil, and coconut oil. Avoid foods with partially hydrogenated oils in them. These have trans fats. Examples are stick margarine, some tub margarines, cookies, crackers, and other baked goods. What foods can I eat? Fruits All fresh, canned (in natural juice), or frozen fruits. Vegetables Fresh or frozen vegetables (raw, steamed, roasted, or grilled). Green salads. Grains Most grains. Choose whole wheat and whole grains most of the time. Rice andpasta, including brown rice and pastas made with whole wheat. Meats and other proteins Lean, well-trimmed beef, veal, pork, and lamb. Chicken and turkey without skin. All fish and shellfish. Wild duck, rabbit, pheasant, and venison. Egg whites or low-cholesterol egg substitutes. Dried beans, peas, lentils, and tofu. Seedsand most nuts. Dairy Low-fat or nonfat cheeses, including ricotta and mozzarella. Skim or 1% milk that is liquid, powdered, or evaporated. Buttermilk that is made with low-fatmilk. Nonfat or low-fat yogurt. Fats and oils Non-hydrogenated (trans-free) margarines. Vegetable oils, including soybean, sesame,   sunflower, olive, peanut, safflower, corn, canola, and cottonseed. Salad dressings or mayonnaisemade with a vegetable oil. Beverages Mineral water. Coffee and tea. Diet carbonated beverages. Sweets and desserts Sherbet, gelatin, and fruit ice. Small  amounts of dark chocolate. Limit all sweets and desserts. Seasonings and condiments All seasonings and condiments. The items listed above may not be a complete list of foods and drinks you can eat. Contact a dietitian for more options. What foods should I avoid? Fruits Canned fruit in heavy syrup. Fruit in cream or butter sauce. Fried fruit. Limitcoconut. Vegetables Vegetables cooked in cheese, cream, or butter sauce. Fried vegetables. Grains Breads that are made with saturated or trans fats, oils, or whole milk. Croissants. Sweet rolls. Donuts. High-fat crackers,such as cheese crackers. Meats and other proteins Fatty meats, such as hot dogs, ribs, sausage, bacon, rib-eye roast or steak. High-fat deli meats, such as salami and bologna. Caviar. Domestic duck andgoose. Organ meats, such as liver. Dairy Cream, sour cream, cream cheese, and creamed cottage cheese. Whole-milk cheeses. Whole or 2% milk that is liquid, evaporated, or condensed. Whole buttermilk. Cream sauce or high-fat cheese sauce. Yogurt that is made fromwhole milk. Fats and oils Meat fat, or shortening. Cocoa butter, hydrogenated oils, palm oil, coconut oil, palm kernel oil. Solid fats and shortenings, including bacon fat, salt pork, lard, and butter. Nondairy cream substitutes. Salad dressings with cheeseor sour cream. Beverages Regular sodas and juice drinks with added sugar. Sweets and desserts Frosting. Pudding. Cookies. Cakes. Pies. Milk chocolate or white chocolate.Buttered syrups. Full-fat ice cream or ice cream drinks. The items listed above may not be a complete list of foods and drinks to avoid. Contact a dietitian for more information. Summary Heart-healthy meal planning includes eating less unhealthy fats, eating more healthy fats, and making other changes in your diet. Eat a balanced diet. This includes fruits and vegetables, low-fat or nonfat dairy, lean protein, nuts and legumes, whole grains, and heart-healthy  oils and fats. This information is not intended to replace advice given to you by your health care provider. Make sure you discuss any questions you have with your healthcare provider. Document Revised: 12/06/2017 Document Reviewed: 11/09/2017 Elsevier Patient Education  2022 Elsevier Inc.  

## 2021-05-12 NOTE — Assessment & Plan Note (Signed)
Continue use of brace as needed

## 2021-05-12 NOTE — Progress Notes (Signed)
HPI  Patient presents the clinic today to establish care.  HLD: Her last LDL was 197, triglycerides 156, 11/2020.  She is not taking Fish Oil OTC.  She has been trying to consume a low-fat diet.  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  She wears a brace daily for comfort.  Past Medical History:  Diagnosis Date   History of colon polyps     Current Outpatient Medications  Medication Sig Dispense Refill   Ascorbic Acid (VITAMIN C) 1000 MG tablet Take 1,000 mg by mouth daily.     Calcium Carb-Cholecalciferol (CALCIUM 600 + D PO) Take 1-2 tablets by mouth daily.     cholecalciferol (VITAMIN D3) 25 MCG (1000 UNIT) tablet Take 1,000 Units by mouth daily.     estradiol (ESTRACE) 0.1 MG/GM vaginal cream Place 1 Applicatorful vaginally 2 (two) times a week. 42.5 g 2   Omega-3 Fatty Acids (FISH OIL) 1000 MG CAPS Take 1 capsule by mouth daily.     zinc gluconate 50 MG tablet Take 50 mg by mouth daily.     No current facility-administered medications for this visit.    No Known Allergies  Family History  Problem Relation Age of Onset   Hypertension Mother    Cancer Father    Breast cancer Neg Hx     Social History   Socioeconomic History   Marital status: Single    Spouse name: Not on file   Number of children: Not on file   Years of education: Not on file   Highest education level: Not on file  Occupational History   Not on file  Tobacco Use   Smoking status: Never   Smokeless tobacco: Never  Vaping Use   Vaping Use: Never used  Substance and Sexual Activity   Alcohol use: No   Drug use: No   Sexual activity: Yes  Other Topics Concern   Not on file  Social History Narrative   Not on file   Social Determinants of Health   Financial Resource Strain: Not on file  Food Insecurity: Not on file  Transportation Needs: Not on file  Physical Activity: Not on file  Stress: Not on file  Social Connections: Not on file  Intimate Partner Violence:  Not on file    ROS:  Constitutional: Denies fever, malaise, fatigue, headache or abrupt weight changes.  HEENT: Denies eye pain, eye redness, ear pain, ringing in the ears, wax buildup, runny nose, nasal congestion, bloody nose, or sore throat. Respiratory: Denies difficulty breathing, shortness of breath, cough or sputum production.   Cardiovascular: Denies chest pain, chest tightness, palpitations or swelling in the hands or feet.  Gastrointestinal: Denies abdominal pain, bloating, constipation, diarrhea or blood in the stool.  GU: Denies frequency, urgency, pain with urination, blood in urine, odor or discharge. Musculoskeletal: Patient reports chronic right wrist pain.  Denies decrease in range of motion, difficulty with gait, muscle pain or joint swelling.  Skin: Denies redness, rashes, lesions or ulcercations.  Neurological: Denies dizziness, difficulty with memory, difficulty with speech or problems with balance and coordination.  Psych: Denies anxiety, depression, SI/HI.  No other specific complaints in a complete review of systems (except as listed in HPI above).  PE:  BP 129/69 (BP Location: Right Arm, Patient Position: Sitting, Cuff Size: Normal)   Pulse 67   Temp (!) 97.3 F (36.3 C) (Temporal)   Resp 17   Ht '5\' 6"'$  (1.676 m)   Wt 153 lb 3.2  oz (69.5 kg)   LMP  (LMP Unknown) Comment: Hysterectomy  SpO2 99%   BMI 24.73 kg/m   Wt Readings from Last 3 Encounters:  11/19/20 145 lb (65.8 kg)  11/03/19 156 lb (70.8 kg)  12/20/18 145 lb (65.8 kg)    General: Appears her stated age, well developed, well nourished in NAD. HEENT: Head: normal shape and size; Eyes: sclera white and EOMs intact;  Cardiovascular: Normal rate and rhythm. S1,S2 noted.  No murmur, rubs or gallops noted. No JVD or BLE edema. No carotid bruits noted. Pulmonary/Chest: Normal effort and positive vesicular breath sounds. No respiratory distress. No wheezes, rales or ronchi noted.  Musculoskeletal: No  difficulty with gait.  Neurological: Alert and oriented.  Psychiatric: Mood and affect normal. Behavior is normal. Judgment and thought content normal.    BMET    Component Value Date/Time   NA 141 11/19/2020 1049   K 4.0 11/19/2020 1049   CL 103 11/19/2020 1049   CO2 25 11/19/2020 1049   GLUCOSE 92 11/19/2020 1049   GLUCOSE 89 06/27/2016 0950   BUN 19 11/19/2020 1049   CREATININE 0.68 11/19/2020 1049   CALCIUM 9.9 11/19/2020 1049   GFRNONAA 95 11/19/2020 1049   GFRAA 110 11/19/2020 1049    Lipid Panel     Component Value Date/Time   CHOL 265 (H) 11/19/2020 1049   TRIG 156 (H) 11/19/2020 1049   HDL 39 (L) 11/19/2020 1049   CHOLHDL 6.8 (H) 11/19/2020 1049   CHOLHDL 7 06/27/2016 0950   VLDL 42.6 (H) 06/27/2016 0950   LDLCALC 197 (H) 11/19/2020 1049    CBC    Component Value Date/Time   WBC 7.2 11/03/2019 1115   WBC 7.5 06/27/2016 0950   RBC 4.86 11/03/2019 1115   RBC 4.62 06/27/2016 0950   HGB 15.0 11/03/2019 1115   HCT 43.9 11/03/2019 1115   PLT 377 11/03/2019 1115   MCV 90 11/03/2019 1115   MCH 30.9 11/03/2019 1115   MCHC 34.2 11/03/2019 1115   MCHC 34.2 06/27/2016 0950   RDW 12.6 11/03/2019 1115    Hgb A1C No results found for: HGBA1C   Assessment and Plan:    Webb Silversmith, NP This visit occurred during the SARS-CoV-2 public health emergency.  Safety protocols were in place, including screening questions prior to the visit, additional usage of staff PPE, and extensive cleaning of exam room while observing appropriate contact time as indicated for disinfecting solutions.

## 2021-05-12 NOTE — Assessment & Plan Note (Signed)
A1c, C-Met and lipid profile today Encouraged her to consume a low-fat diet

## 2021-05-13 LAB — COMPLETE METABOLIC PANEL WITH GFR
AG Ratio: 1.6 (calc) (ref 1.0–2.5)
ALT: 17 U/L (ref 6–29)
AST: 19 U/L (ref 10–35)
Albumin: 4.2 g/dL (ref 3.6–5.1)
Alkaline phosphatase (APISO): 91 U/L (ref 37–153)
BUN: 18 mg/dL (ref 7–25)
CO2: 28 mmol/L (ref 20–32)
Calcium: 9.6 mg/dL (ref 8.6–10.4)
Chloride: 107 mmol/L (ref 98–110)
Creat: 0.67 mg/dL (ref 0.50–1.05)
Globulin: 2.7 g/dL (calc) (ref 1.9–3.7)
Glucose, Bld: 81 mg/dL (ref 65–99)
Potassium: 4.5 mmol/L (ref 3.5–5.3)
Sodium: 143 mmol/L (ref 135–146)
Total Bilirubin: 1.2 mg/dL (ref 0.2–1.2)
Total Protein: 6.9 g/dL (ref 6.1–8.1)
eGFR: 99 mL/min/{1.73_m2} (ref >=60)

## 2021-05-13 LAB — HEMOGLOBIN A1C
Hgb A1c MFr Bld: 5.4 % of total Hgb (ref <5.7)
Mean Plasma Glucose: 108 mg/dL
eAG (mmol/L): 6 mmol/L

## 2021-05-13 LAB — LIPID PANEL
Cholesterol: 242 mg/dL — ABNORMAL HIGH (ref <200)
HDL: 39 mg/dL — ABNORMAL LOW (ref >=50)
LDL Cholesterol (Calc): 169 mg/dL (calc) — ABNORMAL HIGH
Non-HDL Cholesterol (Calc): 203 mg/dL (calc) — ABNORMAL HIGH (ref <130)
Total CHOL/HDL Ratio: 6.2 (calc) — ABNORMAL HIGH (ref <5.0)
Triglycerides: 188 mg/dL — ABNORMAL HIGH (ref <150)

## 2021-12-29 DIAGNOSIS — Z9889 Other specified postprocedural states: Secondary | ICD-10-CM | POA: Diagnosis not present

## 2022-02-23 NOTE — Progress Notes (Signed)
? ?PCP: Jearld Fenton, NP ? ? ?Chief Complaint  ?Patient presents with  ? Gynecologic Exam  ?  No concerns  ? ? ?HPI: ?     Ms. Bridget Burns is a 61 y.o. G0P0000 whose LMP was No LMP recorded (lmp unknown). Patient has had a hysterectomy., presents today for her annual examination.  Her menses are absent due to hyst 2015; no PMB. She has improving vasomotor sx.  ? ?Sex activity: single partner, contraception - status post hysterectomy. She does have vaginal dryness; burning with lubricants, hasn't tried coconut oil. Uses estrace vag crm sometimes; sx improved if uses wkly but doesn't always do that. Doesn't need RF currently.  ? ?Last Pap: Not applicable; pt s/p hyst; ho hx of abn paps.  ? ?Last mammogram: 04/28/21  Results were: normal--routine follow-up in 12 months ?There is no FH of breast cancer. There is no FH of ovarian cancer. The patient does not do self-breast exams. ? ?Colonoscopy: 3/20 at [facility] with polyp; Repeat due after 5 years.  ? ?Tobacco use: The patient denies current or previous tobacco use. ?Alcohol use: none ?No drug use ?Exercise: moderately active ? ?She does get adequate calcium and Vitamin D in her diet. ? ?Labs with PCP. Has hyperlipidemia ? ? ?Patient Active Problem List  ? Diagnosis Date Noted  ? HLD (hyperlipidemia) 05/12/2021  ? Chronic pain of right wrist 05/12/2021  ? ? ?Past Surgical History:  ?Procedure Laterality Date  ? ABDOMINAL HYSTERECTOMY  2015  ? total  ? APPENDECTOMY    ? COLONOSCOPY WITH PROPOFOL N/A 12/20/2018  ? Procedure: COLONOSCOPY WITH PROPOFOL;  Surgeon: Virgel Manifold, MD;  Location: [facility] ENDOSCOPY;  Service: Endoscopy;  Laterality: N/A;  ? WISDOM TOOTH EXTRACTION    ? ? ?Family History  ?Problem Relation Age of Onset  ? Hypertension Mother   ? Other Father   ?     Brain Tumor, benign  ? Pancreatic cancer Maternal Aunt   ?     42?  ? Breast cancer Neg Hx   ? ? ?Social History  ? ?Socioeconomic History  ? Marital status: Single  ?  Spouse name: Not on  file  ? Number of children: Not on file  ? Years of education: Not on file  ? Highest education level: Not on file  ?Occupational History  ? Not on file  ?Tobacco Use  ? Smoking status: Never  ? Smokeless tobacco: Never  ?Vaping Use  ? Vaping Use: Never used  ?Substance and Sexual Activity  ? Alcohol use: No  ? Drug use: No  ? Sexual activity: Yes  ?  Birth control/protection: Surgical  ?  Comment: Hysterectomy  ?Other Topics Concern  ? Not on file  ?Social History Narrative  ? Not on file  ? ?Social Determinants of Health  ? ?Financial Resource Strain: Not on file  ?Food Insecurity: Not on file  ?Transportation Needs: Not on file  ?Physical Activity: Not on file  ?Stress: Not on file  ?Social Connections: Not on file  ?Intimate Partner Violence: Not on file  ? ? ? ?Current Outpatient Medications:  ?  Ascorbic Acid (VITAMIN C) 1000 MG tablet, Take 1,000 mg by mouth daily., Disp: , Rfl:  ?  Calcium Carb-Cholecalciferol (CALCIUM 600 + D PO), Take 1-2 tablets by mouth daily., Disp: , Rfl:  ?  cholecalciferol (VITAMIN D3) 25 MCG (1000 UNIT) tablet, Take 1,000 Units by mouth daily., Disp: , Rfl:  ?  estradiol (ESTRACE) 0.1 MG/GM  vaginal cream, Place 1 Applicatorful vaginally 2 (two) times a week., Disp: 42.5 g, Rfl: 2 ?  Omega-3 Fatty Acids (FISH OIL) 1000 MG CAPS, Take 1 capsule by mouth daily., Disp: , Rfl:  ?  zinc gluconate 50 MG tablet, Take 50 mg by mouth daily., Disp: , Rfl:  ? ? ? ? ?ROS: ? ?Review of Systems  ?Constitutional:  Negative for fatigue, fever and unexpected weight change.  ?Respiratory:  Negative for cough, shortness of breath and wheezing.   ?Cardiovascular:  Negative for chest pain, palpitations and leg swelling.  ?Gastrointestinal:  Negative for blood in stool, constipation, diarrhea, nausea and vomiting.  ?Endocrine: Negative for cold intolerance, heat intolerance and polyuria.  ?Genitourinary:  Negative for dyspareunia, dysuria, flank pain, frequency, genital sores, hematuria, menstrual problem,  pelvic pain, urgency, vaginal bleeding, vaginal discharge and vaginal pain.  ?Musculoskeletal:  Negative for back pain, joint swelling and myalgias.  ?Skin:  Negative for rash.  ?Neurological:  Negative for dizziness, syncope, light-headedness, numbness and headaches.  ?Hematological:  Negative for adenopathy.  ?Psychiatric/Behavioral:  Negative for agitation, confusion, sleep disturbance and suicidal ideas. The patient is not nervous/anxious.   ?BREAST: No symptoms ? ? ? ?Objective: ?BP 120/60   Ht '5\' 6"'$  (1.676 m)   Wt 152 lb (68.9 kg)   LMP  (LMP Unknown) Comment: Hysterectomy  BMI 24.53 kg/m?  ? ? ?Physical Exam ?Constitutional:   ?   Appearance: She is well-developed.  ?Genitourinary:  ?   Vulva normal.  ?   Genitourinary Comments: UTERUS/CX SURG REM  ?   Right Labia: No rash, tenderness or lesions. ?   Left Labia: No tenderness, lesions or rash. ?   Vaginal cuff intact. ?   No vaginal discharge, erythema or tenderness.  ?   Moderate vaginal atrophy present. ? ?   Right Adnexa: not tender and no mass present. ?   Left Adnexa: not tender and no mass present. ?   Cervix is absent.  ?   Uterus is absent.  ?Breasts: ?   Right: No mass, nipple discharge, skin change or tenderness.  ?   Left: No mass, nipple discharge, skin change or tenderness.  ?Neck:  ?   Thyroid: No thyromegaly.  ?Cardiovascular:  ?   Rate and Rhythm: Normal rate and regular rhythm.  ?   Heart sounds: Normal heart sounds. No murmur heard. ?Pulmonary:  ?   Effort: Pulmonary effort is normal.  ?   Breath sounds: Normal breath sounds.  ?Abdominal:  ?   Palpations: Abdomen is soft.  ?   Tenderness: There is no abdominal tenderness. There is no guarding.  ?Musculoskeletal:     ?   General: Normal range of motion.  ?   Cervical back: Normal range of motion.  ?Neurological:  ?   General: No focal deficit present.  ?   Mental Status: She is alert and oriented to person, place, and time.  ?   Cranial Nerves: No cranial nerve deficit.  ?Skin: ?    General: Skin is warm and dry.  ?Psychiatric:     ?   Mood and Affect: Mood normal.     ?   Behavior: Behavior normal.     ?   Thought Content: Thought content normal.     ?   Judgment: Judgment normal.  ?Vitals reviewed.  ? ? ?Assessment/Plan: ? ?Encounter for annual routine gynecological examination ? ?Encounter for screening mammogram for malignant neoplasm of breast - Plan: MM 3D SCREEN BREAST BILATERAL;  pt to schedule mammo ? ?Postmenopausal atrophic vaginitis--pt to try coconut oil as lubricant; will RF estrace crm prn.  ? ?Pt needed letter for work per supervisor to continue to work straight and not take breaks due to fatigue at 9:00 PM when shift supposed to end. Letter given to pt.  ? ?        ?GYN counsel breast self exam, mammography screening, menopause, adequate intake of calcium and vitamin D, diet and exercise ? ?  F/U ? Return in about 1 year (around 03/01/2023). ? ?Amanee Iacovelli B. Donyae Kilner, PA-C ?02/28/2022 ?10:05 AM ?

## 2022-02-28 ENCOUNTER — Ambulatory Visit (INDEPENDENT_AMBULATORY_CARE_PROVIDER_SITE_OTHER): Payer: BC Managed Care – PPO | Admitting: Obstetrics and Gynecology

## 2022-02-28 ENCOUNTER — Encounter: Payer: Self-pay | Admitting: Obstetrics and Gynecology

## 2022-02-28 VITALS — BP 120/60 | Ht 66.0 in | Wt 152.0 lb

## 2022-02-28 DIAGNOSIS — N952 Postmenopausal atrophic vaginitis: Secondary | ICD-10-CM

## 2022-02-28 DIAGNOSIS — Z01419 Encounter for gynecological examination (general) (routine) without abnormal findings: Secondary | ICD-10-CM | POA: Diagnosis not present

## 2022-02-28 DIAGNOSIS — Z1231 Encounter for screening mammogram for malignant neoplasm of breast: Secondary | ICD-10-CM

## 2022-02-28 NOTE — Patient Instructions (Addendum)
I value your feedback and you entrusting us with your care. If you get a [facility] patient survey, I would appreciate you taking the time to let us know about your experience today. Thank you!  [facility]: [phone]      

## 2022-05-01 ENCOUNTER — Ambulatory Visit
Admission: RE | Admit: 2022-05-01 | Discharge: 2022-05-01 | Disposition: A | Discharge status: Home / Self Care | Payer: BC Managed Care – PPO | Source: Ambulatory Visit | Attending: Obstetrics and Gynecology | Admitting: Obstetrics and Gynecology

## 2022-05-01 DIAGNOSIS — Z1231 Encounter for screening mammogram for malignant neoplasm of breast: Secondary | ICD-10-CM | POA: Insufficient documentation

## 2022-06-29 ENCOUNTER — Ambulatory Visit (INDEPENDENT_AMBULATORY_CARE_PROVIDER_SITE_OTHER): Payer: BC Managed Care – PPO | Admitting: Internal Medicine

## 2022-06-29 ENCOUNTER — Encounter: Payer: Self-pay | Admitting: Internal Medicine

## 2022-06-29 VITALS — BP 118/72 | HR 70 | Temp 97.3°F | Ht 66.0 in | Wt 150.0 lb

## 2022-06-29 DIAGNOSIS — Z780 Asymptomatic menopausal state: Secondary | ICD-10-CM | POA: Diagnosis not present

## 2022-06-29 DIAGNOSIS — E782 Mixed hyperlipidemia: Secondary | ICD-10-CM | POA: Diagnosis not present

## 2022-06-29 DIAGNOSIS — Z23 Encounter for immunization: Secondary | ICD-10-CM | POA: Diagnosis not present

## 2022-06-29 DIAGNOSIS — Z0001 Encounter for general adult medical examination with abnormal findings: Secondary | ICD-10-CM

## 2022-06-29 NOTE — Progress Notes (Signed)
Subjective:    Patient ID: Bridget Burns, female    DOB: 1961-02-26, 61 y.o.   MRN: [id number]  HPI  Pt presents to the clinic today for her annual exam.  Flu: 11/2020 Tetanus: 06/2016 Covid: Pfizer x 2 Shingrix: never Pap smear: hysterectomy Mammogram: 04/2022 Bone density: never Colon screening: 12/2018 Vision screening: annually Dentist: biannually  Diet: She does eat some meat. She consumes fruits and veggies. She tries to avoid fried foods. She drinks mostly Vitamin water and Dt. Cheerwine Exercise: Treadmill 3-4 times weekly  Review of Systems     Past Medical History:  Diagnosis Date   History of colon polyps     Current Outpatient Medications  Medication Sig Dispense Refill   Ascorbic Acid (VITAMIN C) 1000 MG tablet Take 1,000 mg by mouth daily.     Calcium Carb-Cholecalciferol (CALCIUM 600 + D PO) Take 1-2 tablets by mouth daily.     cholecalciferol (VITAMIN D3) 25 MCG (1000 UNIT) tablet Take 1,000 Units by mouth daily.     estradiol (ESTRACE) 0.1 MG/GM vaginal cream Place 1 Applicatorful vaginally 2 (two) times a week. 42.5 g 2   Omega-3 Fatty Acids (FISH OIL) 1000 MG CAPS Take 1 capsule by mouth daily.     zinc gluconate 50 MG tablet Take 50 mg by mouth daily.     No current facility-administered medications for this visit.    No Known Allergies  Family History  Problem Relation Age of Onset   Hypertension Mother    Other Father        Brain Tumor, benign   Pancreatic cancer Maternal Aunt        73?   Breast cancer Neg Hx     Social History   Socioeconomic History   Marital status: Single    Spouse name: Not on file   Number of children: Not on file   Years of education: Not on file   Highest education level: Not on file  Occupational History   Not on file  Tobacco Use   Smoking status: Never   Smokeless tobacco: Never  Vaping Use   Vaping Use: Never used  Substance and Sexual Activity   Alcohol use: No   Drug use: No   Sexual  activity: Yes    Birth control/protection: Surgical    Comment: Hysterectomy  Other Topics Concern   Not on file  Social History Narrative   Not on file   Social Determinants of Health   Financial Resource Strain: Not on file  Food Insecurity: Not on file  Transportation Needs: Not on file  Physical Activity: Not on file  Stress: Not on file  Social Connections: Not on file  Intimate Partner Violence: Not on file     Constitutional: Pt reports fatigue status post covid. Denies fever, malaise, headache or abrupt weight changes.  HEENT: Denies eye pain, eye redness, ear pain, ringing in the ears, wax buildup, runny nose, nasal congestion, bloody nose, or sore throat. Respiratory: Denies difficulty breathing, shortness of breath, cough or sputum production.   Cardiovascular: Denies chest pain, chest tightness, palpitations or swelling in the hands or feet.  Gastrointestinal: Denies abdominal pain, bloating, constipation, diarrhea or blood in the stool.  GU: Denies urgency, frequency, pain with urination, burning sensation, blood in urine, odor or discharge. Musculoskeletal: Patient reports intermittent right wrist pain.  Denies decrease in range of motion, difficulty with gait, muscle pain or joint swelling.  Skin: Denies redness, rashes, lesions or ulcercations.  Neurological:  Denies dizziness, difficulty with memory, difficulty with speech or problems with balance and coordination.  Psych: Denies anxiety, depression, SI/HI.  No other specific complaints in a complete review of systems (except as listed in HPI above).  Objective:   Physical Exam BP 118/72 (BP Location: Right Arm, Patient Position: Sitting, Cuff Size: Normal)   Pulse 70   Temp (!) 97.3 F (36.3 C) (Temporal)   Ht '5\' 6"'  (1.676 m)   Wt 150 lb (68 kg)   LMP  (LMP Unknown) Comment: Hysterectomy  SpO2 99%   BMI 24.21 kg/m   Wt Readings from Last 3 Encounters:  02/28/22 152 lb (68.9 kg)  05/12/21 153 lb 3.2 oz  (69.5 kg)  11/19/20 145 lb (65.8 kg)    General: Appears her stated age, well developed, well nourished in NAD. Skin: Warm, dry and intact.  HEENT: Head: normal shape and size; Eyes: sclera white, no icterus, conjunctiva pink, PERRLA and EOMs intact;  Neck:  Neck supple, trachea midline. No masses, lumps or thyromegaly present.  Cardiovascular: Normal rate and rhythm. S1,S2 noted.  No murmur, rubs or gallops noted. No JVD or BLE edema. No carotid bruits noted. Pulmonary/Chest: Normal effort and positive vesicular breath sounds. No respiratory distress. No wheezes, rales or ronchi noted.  Abdomen:  Normal bowel sounds.  Musculoskeletal: Strength 5/5 BUE/BLE. No difficulty with gait.  Neurological: Alert and oriented. Cranial nerves II-XII grossly intact. Coordination normal.  Psychiatric: Mood and affect normal. Behavior is normal. Judgment and thought content normal.   BMET    Component Value Date/Time   NA 143 05/12/2021 1059   NA 141 11/19/2020 1049   K 4.5 05/12/2021 1059   CL 107 05/12/2021 1059   CO2 28 05/12/2021 1059   GLUCOSE 81 05/12/2021 1059   BUN 18 05/12/2021 1059   BUN 19 11/19/2020 1049   CREATININE 0.67 05/12/2021 1059   CALCIUM 9.6 05/12/2021 1059   GFRNONAA 95 11/19/2020 1049   GFRAA 110 11/19/2020 1049    Lipid Panel     Component Value Date/Time   CHOL 242 (H) 05/12/2021 1059   CHOL 265 (H) 11/19/2020 1049   TRIG 188 (H) 05/12/2021 1059   HDL 39 (L) 05/12/2021 1059   HDL 39 (L) 11/19/2020 1049   CHOLHDL 6.2 (H) 05/12/2021 1059   VLDL 42.6 (H) 06/27/2016 0950   LDLCALC 169 (H) 05/12/2021 1059    CBC    Component Value Date/Time   WBC 7.2 11/03/2019 1115   WBC 7.5 06/27/2016 0950   RBC 4.86 11/03/2019 1115   RBC 4.62 06/27/2016 0950   HGB 15.0 11/03/2019 1115   HCT 43.9 11/03/2019 1115   PLT 377 11/03/2019 1115   MCV 90 11/03/2019 1115   MCH 30.9 11/03/2019 1115   MCHC 34.2 11/03/2019 1115   MCHC 34.2 06/27/2016 0950   RDW 12.6 11/03/2019  1115    Hgb A1C Lab Results  Component Value Date   HGBA1C 5.4 05/12/2021           Assessment & Plan:   Preventative Health Maintenance:  Flu shot today Tetanus UTD Encouraged her to get her COVID booster Discussed Shingrix vaccine, she will check coverage with her insurance company and schedule a nurse visit if she would like to have this done She no longer needs Pap smears Mammogram UTD Bone density ordered-she will call to schedule Colon screening UTD Encouraged her to consume a balanced diet and exercise regimen Advised her to see an eye doctor and dentist annually  We will check CBC, c-Met, lipid today  RTC in 6 months, follow-up chronic conditions Webb Silversmith, NP

## 2022-06-29 NOTE — Patient Instructions (Signed)
Health Maintenance for Postmenopausal Women Menopause is a normal process in which your ability to get pregnant comes to an end. This process happens slowly over many months or years, usually between the ages of 48 and 55. Menopause is complete when you have missed your menstrual period for 12 months. It is important to talk with your health care provider about some of the most common conditions that affect women after menopause (postmenopausal women). These include heart disease, cancer, and bone loss (osteoporosis). Adopting a healthy lifestyle and getting preventive care can help to promote your health and wellness. The actions you take can also lower your chances of developing some of these common conditions. What are the signs and symptoms of menopause? During menopause, you may have the following symptoms: Hot flashes. These can be moderate or severe. Night sweats. Decrease in sex drive. Mood swings. Headaches. Tiredness (fatigue). Irritability. Memory problems. Problems falling asleep or staying asleep. Talk with your health care provider about treatment options for your symptoms. Do I need hormone replacement therapy? Hormone replacement therapy is effective in treating symptoms that are caused by menopause, such as hot flashes and night sweats. Hormone replacement carries certain risks, especially as you become older. If you are thinking about using estrogen or estrogen with progestin, discuss the benefits and risks with your health care provider. How can I reduce my risk for heart disease and stroke? The risk of heart disease, heart attack, and stroke increases as you age. One of the causes may be a change in the body's hormones during menopause. This can affect how your body uses dietary fats, triglycerides, and cholesterol. Heart attack and stroke are medical emergencies. There are many things that you can do to help prevent heart disease and stroke. Watch your blood pressure High  blood pressure causes heart disease and increases the risk of stroke. This is more likely to develop in people who have high blood pressure readings or are overweight. Have your blood pressure checked: Every 3-5 years if you are 18-39 years of age. Every year if you are 40 years old or older. Eat a healthy diet  Eat a diet that includes plenty of vegetables, fruits, low-fat dairy products, and lean protein. Do not eat a lot of foods that are high in solid fats, added sugars, or sodium. Get regular exercise Get regular exercise. This is one of the most important things you can do for your health. Most adults should: Try to exercise for at least 150 minutes each week. The exercise should increase your heart rate and make you sweat (moderate-intensity exercise). Try to do strengthening exercises at least twice each week. Do these in addition to the moderate-intensity exercise. Spend less time sitting. Even light physical activity can be beneficial. Other tips Work with your health care provider to achieve or maintain a healthy weight. Do not use any products that contain nicotine or tobacco. These products include cigarettes, chewing tobacco, and vaping devices, such as e-cigarettes. If you need help quitting, ask your health care provider. Know your numbers. Ask your health care provider to check your cholesterol and your blood sugar (glucose). Continue to have your blood tested as directed by your health care provider. Do I need screening for cancer? Depending on your health history and family history, you may need to have cancer screenings at different stages of your life. This may include screening for: Breast cancer. Cervical cancer. Lung cancer. Colorectal cancer. What is my risk for osteoporosis? After menopause, you may be   at increased risk for osteoporosis. Osteoporosis is a condition in which bone destruction happens more quickly than new bone creation. To help prevent osteoporosis or  the bone fractures that can happen because of osteoporosis, you may take the following actions: If you are 19-50 years old, get at least 1,000 mg of calcium and at least 600 international units (IU) of vitamin D per day. If you are older than age 50 but younger than age 70, get at least 1,200 mg of calcium and at least 600 international units (IU) of vitamin D per day. If you are older than age 70, get at least 1,200 mg of calcium and at least 800 international units (IU) of vitamin D per day. Smoking and drinking excessive alcohol increase the risk of osteoporosis. Eat foods that are rich in calcium and vitamin D, and do weight-bearing exercises several times each week as directed by your health care provider. How does menopause affect my mental health? Depression may occur at any age, but it is more common as you become older. Common symptoms of depression include: Feeling depressed. Changes in sleep patterns. Changes in appetite or eating patterns. Feeling an overall lack of motivation or enjoyment of activities that you previously enjoyed. Frequent crying spells. Talk with your health care provider if you think that you are experiencing any of these symptoms. General instructions See your health care provider for regular wellness exams and vaccines. This may include: Scheduling regular health, dental, and eye exams. Getting and maintaining your vaccines. These include: Influenza vaccine. Get this vaccine each year before the flu season begins. Pneumonia vaccine. Shingles vaccine. Tetanus, diphtheria, and pertussis (Tdap) booster vaccine. Your health care provider may also recommend other immunizations. Tell your health care provider if you have ever been abused or do not feel safe at home. Summary Menopause is a normal process in which your ability to get pregnant comes to an end. This condition causes hot flashes, night sweats, decreased interest in sex, mood swings, headaches, or lack  of sleep. Treatment for this condition may include hormone replacement therapy. Take actions to keep yourself healthy, including exercising regularly, eating a healthy diet, watching your weight, and checking your blood pressure and blood sugar levels. Get screened for cancer and depression. Make sure that you are up to date with all your vaccines. This information is not intended to replace advice given to you by your health care provider. Make sure you discuss any questions you have with your health care provider. Document Revised: 02/21/2021 Document Reviewed: 02/21/2021 Elsevier Patient Education  2023 [employer]  

## 2022-06-29 NOTE — Assessment & Plan Note (Signed)
CMET and lipid profile today Encouraged her to consume a low fat diet 

## 2022-06-30 LAB — LIPID PANEL
Cholesterol: 234 mg/dL — ABNORMAL HIGH (ref <200)
HDL: 33 mg/dL — ABNORMAL LOW (ref >=50)
LDL Cholesterol (Calc): 161 mg/dL (calc) — ABNORMAL HIGH
Non-HDL Cholesterol (Calc): 201 mg/dL (calc) — ABNORMAL HIGH (ref <130)
Total CHOL/HDL Ratio: 7.1 (calc) — ABNORMAL HIGH (ref <5.0)
Triglycerides: 238 mg/dL — ABNORMAL HIGH (ref <150)

## 2022-06-30 LAB — COMPLETE METABOLIC PANEL WITH GFR
AG Ratio: 1.6 (calc) (ref 1.0–2.5)
ALT: 22 U/L (ref 6–29)
AST: 20 U/L (ref 10–35)
Albumin: 4.4 g/dL (ref 3.6–5.1)
Alkaline phosphatase (APISO): 123 U/L (ref 37–153)
BUN: 21 mg/dL (ref 7–25)
CO2: 26 mmol/L (ref 20–32)
Calcium: 9.9 mg/dL (ref 8.6–10.4)
Chloride: 106 mmol/L (ref 98–110)
Creat: 0.78 mg/dL (ref 0.50–1.05)
Globulin: 2.8 g/dL (calc) (ref 1.9–3.7)
Glucose, Bld: 101 mg/dL — ABNORMAL HIGH (ref 65–99)
Potassium: 4.3 mmol/L (ref 3.5–5.3)
Sodium: 141 mmol/L (ref 135–146)
Total Bilirubin: 1.4 mg/dL — ABNORMAL HIGH (ref 0.2–1.2)
Total Protein: 7.2 g/dL (ref 6.1–8.1)
eGFR: 86 mL/min/{1.73_m2} (ref >=60)

## 2022-06-30 LAB — CBC
HCT: 44.7 % (ref 35.0–45.0)
Hemoglobin: 15.2 g/dL (ref 11.7–15.5)
MCH: 31 pg (ref 27.0–33.0)
MCHC: 34 g/dL (ref 32.0–36.0)
MCV: 91.2 fL (ref 80.0–100.0)
MPV: 9.7 fL (ref 7.5–12.5)
Platelets: 391 10*3/uL (ref 140–400)
RBC: 4.9 10*6/uL (ref 3.80–5.10)
RDW: 12.8 % (ref 11.0–15.0)
WBC: 7.2 10*3/uL (ref 3.8–10.8)

## 2022-07-19 ENCOUNTER — Telehealth: Payer: Self-pay

## 2022-07-19 NOTE — Telephone Encounter (Signed)
Pt aware that there is no medical reason for [employer] to recommend pt working three days from home. She said her employer might be reaching out to [facility]. Advised if they do, message will be routed to [facility].

## 2022-07-20 ENCOUNTER — Ambulatory Visit: Payer: BC Managed Care – PPO | Admitting: Clinical

## 2022-07-20 DIAGNOSIS — F411 Generalized anxiety disorder: Secondary | ICD-10-CM | POA: Diagnosis not present

## 2022-07-20 DIAGNOSIS — F332 Major depressive disorder, recurrent severe without psychotic features: Secondary | ICD-10-CM | POA: Diagnosis not present

## 2022-07-20 NOTE — Progress Notes (Signed)
                Landyn Buckalew, LCSW 
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
## 2022-07-28 ENCOUNTER — Ambulatory Visit (INDEPENDENT_AMBULATORY_CARE_PROVIDER_SITE_OTHER): Payer: BC Managed Care – PPO | Admitting: Clinical

## 2022-07-28 DIAGNOSIS — F411 Generalized anxiety disorder: Secondary | ICD-10-CM | POA: Diagnosis not present
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
## 2022-08-04 ENCOUNTER — Ambulatory Visit: Payer: BC Managed Care – PPO | Admitting: Clinical

## 2022-08-04 DIAGNOSIS — F411 Generalized anxiety disorder: Secondary | ICD-10-CM | POA: Diagnosis not present

## 2022-08-04 NOTE — Progress Notes (Signed)
[place] Counselor/Therapist Progress Note  Patient ID: Bridget Burns, MRN: [id number]    Date: 08/04/22  Time Spent: 8:32  am - 9:15 am : 43 Minutes  Treatment Type: Individual Therapy.  Reported Symptoms: Patient reported recent feelings of anxiety, staying in bed at times, and difficulty completing household tasks.   Mental Status Exam: Appearance:  Could not assess      Behavior: Could not assess  Motor: Could not assess  Speech/Language:  Clear and Coherent  Affect: Could not assess  Mood: normal  Thought process: normal  Thought content:   WNL  Sensory/Perceptual disturbances:   WNL  Orientation: oriented to person, place, and situation  Attention: Good  Concentration: Good  Memory: WNL  Fund of knowledge:  Good  Insight:   Good  Judgment:  Good  Impulse Control: Fair   Risk Assessment: Danger to Self:  No Patient denied current suicidal ideation Self-injurious Behavior: No Danger to Others: No Patient denied current homicidal ideation Duty to Warn:no Physical Aggression / Violence:No  Access to Firearms a concern: No  Gang Involvement:No   Subjective:  Patient reported she spoke with human resources who advised patient that a decision regarding patient's work schedule is up to the [employer] discretion. Patient reported experiencing anxiety when driving to work. Patient reported she has had to utilize deep breathing techniques when driving to work. Patient reported she spoke with her manager about her work schedule and Dealer would not alter patient's work schedule. Patient stated, "ive just been kind of gritting my teeth and doing it". Patient reported she is able to work from home on Fridays and has been using self talk, telling herself she can make it until Friday. Patient reported human resources advised they would approve her to work from home with a letter from a physician. Patient stated, "today I'm not bad" and reported this is due to  working from home today and not driving to work. Patient stated, "I feel if I can get the stress and anxiety under control other things will fall into place". Patient reported difficulty performing household chores and the chores associated with her animals. Patient reported she feels better after going to the gym.   Interventions: Motivational Interviewing. Clinician conducted session via telephone from clinician's home office per patient's request. Patient provided verbal consent to proceed with telephonic session and participated in session from patient's home.  Discussed patient's recent call with human resources and the outcome of the call. Provided psycho education related to depressive symptoms and use of a thought record. Clinician utilized motivational interviewing to explore goals for therapy. Clinician utilized a task centered approach in collaboration with patient to develop goals for therapy. Clinician requested patient complete thought record for homework.   Diagnosis:  Generalized anxiety disorder   Plan: Patient is to utilize [facility], thought re-framing, relaxation techniques, mindfulness and coping strategies to decrease symptoms associated with Generalized Anxiety Disorder. Frequency: weekly  Modality: individual     Long-term goal:   Reduce overall level, frequency, and intensity of the symptoms of depression and anxiety as evidenced by decreased returning to bed in the mornings, scratching when anxious, crying, not wanting to leave the house, sadness, worry, fatigue, lack of energy, staying in bed frequently, difficulty staying asleep, decreased concentration, easily agitated, irritability, slight change in memory, feeling on edge, panic attacks, headaches, checking door locks multiple times when leaving the house, checking to ensure the water is off when leaving the house from 6 to  7 days per week to 2 days per week per patient's report for at least 6  consecutive months.  Target Date: 08/05/23  Progress: progressing   Short-term goal:  Increase participation in daily household activities, such as, washing dishes, dusting, household cleaning from 0 days per week to 2 times per week per patient's report  Target Date: 02/03/23  Progress: progressing   Increase participation in activities patient enjoys, such as, going to the gym, reading, going to [employer], spending time with her horses, or attending a museum from 2 days per week to 4 days per week  Target Date: 02/03/23  Progress: progressing   Decrease daily level of anxiety and depressive symptoms by developing healthy coping mechanisms  Target Date: 02/03/23  Progress: progressing   Decrease level of anxiety surrounding retirement by developing a plan for post retirement   Target Date: 02/03/23  Progress: progressing                       Katherina Right, LCSW

## 2022-08-18 ENCOUNTER — Ambulatory Visit: Payer: BC Managed Care – PPO | Admitting: Clinical

## 2022-08-25 ENCOUNTER — Ambulatory Visit (INDEPENDENT_AMBULATORY_CARE_PROVIDER_SITE_OTHER): Payer: BC Managed Care – PPO | Admitting: Clinical

## 2022-08-25 DIAGNOSIS — F411 Generalized anxiety disorder: Secondary | ICD-10-CM | POA: Diagnosis not present

## 2022-08-25 NOTE — Progress Notes (Signed)
                Nirvaan Frett, LCSW 

## 2022-08-25 NOTE — Progress Notes (Signed)
[facility] Counselor/Therapist Progress Note  Patient ID: Bridget Burns, MRN: [id number],    Date: 08/25/2022  Time Spent: 8:31am - 9:20am : 49 minutes  Treatment Type: Individual Therapy  Reported Symptoms: Patient stated, "somewhat anxious but a little bit hopeful" as it relates to current mood.  Mental Status Exam: Appearance:  Could not assess      Behavior: Could not assess  Motor: Could not assess  Speech/Language:  Clear and Coherent  Affect: Could not assess  Mood: anxious  Thought process: normal  Thought content:   WNL  Sensory/Perceptual disturbances:   WNL  Orientation: oriented to person, place, and situation  Attention: Good  Concentration: Good  Memory: WNL  Fund of knowledge:  Good  Insight:   Good  Judgment:  Good  Impulse Control: Fair   Risk Assessment: Danger to Self:  No Patient denied current suicidal ideation Self-injurious Behavior: No Danger to Others: No Patient denied current homicidal ideation Duty to Warn:no Physical Aggression / Violence:No  Access to Firearms a concern: No  Gang Involvement:No   Subjective: Patient stated, "things at work have been really bad".  Patient reported stress due to the drive to and from work, and reported recent stressors related to her job and lack of support from her Freight forwarder. Patient reported she has been at her job for 17 years and has never experienced a performance issue until now due to a change in call volume. Patient reported her employer made changes to the work environment but her manager can not disclose any information regarding the changes. Patient reported concerned about her job. Patient reported there have been changes in moral at work and her employer discourages employees from interacting with other employees. Patient reported she discovered her employer is controlling how the calls are routed to employees. Patient reported this is a trigger for anxiety and reported feeling she has  to "put on a front" in front of her co-workers. Patient reported she's "torn" in regards to staying with her current employer or changing jobs. Patient reported experiencing less anxiety when working from home since she doesn't have to hear co-workers receive inbound calls while she's making outbound calls and reported this triggers anxiety. Patient reported she is not able to transfer to another department due to a hiring freeze. Patient reported triggers indicated in her thought record were work related and feelings of stress, anger, hurt. Patient reported she catastrophizes and jumps to conclusions in response to triggers. Patient stated, "I already see myself unemployed". Patient reported she doesn't like uncertainty.   Interventions: Cognitive Behavioral Therapy. Clinician conducted session via telephone from clinician's home office per patient's request. Patient provided verbal consent to proceed with telephonic session and participated in session from patient's home.  Discussed patient's concerns related to her job, explored patient's options to address her concerns at work and the possibility of changing jobs. Reviewed patient's thought record for homework. Explored, identified, and challenged thought distortions associated with patient's job. Provided psycho education related to thought distortions and countering anxiety with rational thoughts. Clinician requested patient continue thought record and indicate a rational thought for each distortion in thought record for homework.      Diagnosis:  Generalized anxiety disorder     Plan: Patient is to utilize [employer] [facility], thought re-framing, relaxation techniques, mindfulness and coping strategies to decrease symptoms associated with Generalized Anxiety Disorder. Frequency: weekly  Modality: individual      Long-term goal:   Reduce overall level, frequency, and intensity of  the symptoms of depression and anxiety as evidenced by  decreased returning to bed in the mornings, scratching when anxious, crying, not wanting to leave the house, sadness, worry, fatigue, lack of energy, staying in bed frequently, difficulty staying asleep, decreased concentration, easily agitated, irritability, slight change in memory, feeling on edge, panic attacks, headaches, checking door locks multiple times when leaving the house, checking to ensure the water is off when leaving the house from 6 to 7 days per week to 2 days per week per patient's report for at least 6 consecutive months.   Target Date: 08/05/23  Progress: progressing    Short-term goal:  Increase participation in daily household activities, such as, washing dishes, dusting, household cleaning from 0 days per week to 2 times per week per patient's report   Target Date: 02/03/23  Progress: progressing    Increase participation in activities patient enjoys, such as, going to the gym, reading, going to [employer], spending time with her horses, or attending a museum from 2 days per week to 4 days per week   Target Date: 02/03/23  Progress: progressing    Decrease daily level of anxiety and depressive symptoms by developing healthy coping mechanisms   Target Date: 02/03/23  Progress: progressing    Decrease level of anxiety surrounding retirement by developing a plan for post retirement    Target Date: 02/03/23  Progress: progressing                           Katherina Right, LCSW

## 2022-09-15 ENCOUNTER — Ambulatory Visit (INDEPENDENT_AMBULATORY_CARE_PROVIDER_SITE_OTHER): Payer: BC Managed Care – PPO | Admitting: Clinical

## 2022-09-15 DIAGNOSIS — F411 Generalized anxiety disorder: Secondary | ICD-10-CM | POA: Diagnosis not present

## 2022-09-15 NOTE — Progress Notes (Signed)
                Okla Qazi, LCSW 

## 2022-09-15 NOTE — Progress Notes (Signed)
[place] Counselor/Therapist Progress Note  Patient ID: Bridget Burns, MRN: [id number],    Date: 09/15/2022  Time Spent: 8:31am - 9:19am : 48 minutes   Treatment Type: Individual Therapy  Reported Symptoms: Patient reported a recent decrease in symptoms of anxiety.   Mental Status Exam: Appearance:  Could not assess      Behavior: Could not assess  Motor: Could not assess  Speech/Language:  Clear and Coherent  Affect: Could not assess  Mood: normal  Thought process: normal  Thought content:   WNL  Sensory/Perceptual disturbances:   WNL  Orientation: oriented to person, place, and situation  Attention: Good  Concentration: Good  Memory: WNL  Fund of knowledge:  Good  Insight:   Good  Judgment:  Good  Impulse Control: Good   Risk Assessment: Danger to Self:  No Patient denied current suicidal ideation Self-injurious Behavior: No Danger to Others: No Patient denied current homicidal ideation Duty to Warn:no Physical Aggression / Violence:No  Access to Firearms a concern: No  Gang Involvement:No   Subjective: Patient stated, "things are actually going pretty well".  Patient reported she recently met with her manager and received additional information regarding recent changes at work. Patient stated, "I was relaxed and I was ok" and stated, "its like a weight has been lifted" and "I'm back to normal". Patient stated, "there's always some stress in my life and I tend to over react". Patient reported when feeling "back to normal" she performs her daily activities, experiences increased energy, and decreased fatigue. Patient stated, "it feels like survival" in response to situations in which patient feels she "over reacts". Patient reported previous life experiences in which the worst case scenario occurred and as a result patient feels she has to be prepared for the worst case scenario. Patient reported she did not complete much of her thought record due to  decrease in stress. Patient reported in that past she has experienced paranoia in response to feelings of anxiety and generalizes those feelings to other events that occur in her life.  Patient identified situations in which patient has experienced thought distortions, such as, catastrophizing and generalization. Patient stated, "I will play the situation over and over in my head" to gain insight into the situation and has acknowledged the outcome/situation did not change in response to the thoughts. Patient stated, "I typically focus on the negative". Patient reported feeling anxious about reframing thoughts.   Interventions: Cognitive Behavioral Therapy. Clinician conducted session via telephone from clinician's home office per patient's request. Patient provided verbal consent to proceed with telephonic session and participated in session from patient's home.  Discussed recent improvement in symptoms of anxiety and contributing factors to change in symptoms. Discussed and clarified patient's meaning of feeling "back to normal". Explored patient's thoughts when patient feels she "over reacts" to situations, such as, feeling she has to find another job and "feels like survival". Explored and identified situations in which patient has experienced thought distortions of generalization and catastrophizing. Provided psycho education related to symptoms of anxiety and cycle of anxiety. Provided psycho education related to challenging negative thoughts and thought distortions. Explored additional types of thought distortions patient has experienced. Clinician requested patient continue thought record and indicate a rational thought for each distortion, as well as, answer questions to challenge irrational thought for homework.      Diagnosis:  Generalized anxiety disorder     Plan: Patient is to utilize [employer] [facility], thought re-framing, relaxation techniques, mindfulness and coping strategies  to  decrease symptoms associated with Generalized Anxiety Disorder. Frequency: weekly  Modality: individual      Long-term goal:   Reduce overall level, frequency, and intensity of the symptoms of depression and anxiety as evidenced by decreased returning to bed in the mornings, scratching when anxious, crying, not wanting to leave the house, sadness, worry, fatigue, lack of energy, staying in bed frequently, difficulty staying asleep, decreased concentration, easily agitated, irritability, slight change in memory, feeling on edge, panic attacks, headaches, checking door locks multiple times when leaving the house, checking to ensure the water is off when leaving the house from 6 to 7 days per week to 2 days per week per patient's report for at least 6 consecutive months.   Target Date: 08/05/23  Progress: progressing    Short-term goal:  Increase participation in daily household activities, such as, washing dishes, dusting, household cleaning from 0 days per week to 2 times per week per patient's report   Target Date: 02/03/23  Progress: progressing    Increase participation in activities patient enjoys, such as, going to the gym, reading, going to [employer], spending time with her horses, or attending a museum from 2 days per week to 4 days per week   Target Date: 02/03/23  Progress: progressing    Decrease daily level of anxiety and depressive symptoms by developing healthy coping mechanisms   Target Date: 02/03/23  Progress: progressing    Decrease level of anxiety surrounding retirement by developing a plan for post retirement    Target Date: 02/03/23  Progress: progressing    Katherina Right, LCSW

## 2022-09-19 ENCOUNTER — Other Ambulatory Visit: Payer: Self-pay | Admitting: Internal Medicine

## 2022-09-19 ENCOUNTER — Ambulatory Visit
Admission: RE | Admit: 2022-09-19 | Discharge: 2022-09-19 | Disposition: A | Discharge status: Home / Self Care | Payer: BC Managed Care – PPO | Source: Ambulatory Visit | Attending: Internal Medicine | Admitting: Internal Medicine

## 2022-09-19 DIAGNOSIS — Z780 Asymptomatic menopausal state: Secondary | ICD-10-CM | POA: Diagnosis not present

## 2022-09-19 DIAGNOSIS — M810 Age-related osteoporosis without current pathological fracture: Secondary | ICD-10-CM | POA: Insufficient documentation

## 2022-09-22 ENCOUNTER — Ambulatory Visit: Payer: BC Managed Care – PPO | Admitting: Clinical

## 2022-10-02 ENCOUNTER — Ambulatory Visit (INDEPENDENT_AMBULATORY_CARE_PROVIDER_SITE_OTHER): Payer: BC Managed Care – PPO | Admitting: Clinical

## 2022-10-02 DIAGNOSIS — F411 Generalized anxiety disorder: Secondary | ICD-10-CM | POA: Diagnosis not present

## 2022-10-02 NOTE — Progress Notes (Signed)
[place] Counselor/Therapist Progress Note  Patient ID: Bridget Burns, MRN: [id number],    Date: 10/02/2022  Time Spent: 8:30am -  9:18am : 48 minutes  Treatment Type: Individual Therapy  Reported Symptoms: patient reported excessive fear.   Mental Status Exam: Appearance:  Could not assess      Behavior: Could not assess  Motor: Could not assess  Speech/Language:  Clear and Coherent  Affect: Could not assess  Mood: normal  Thought process: normal  Thought content:   WNL  Sensory/Perceptual disturbances:   WNL  Orientation: oriented to person, place, and situation  Attention: Good  Concentration: Good  Memory: WNL  Fund of knowledge:  Good  Insight:   Fair  Judgment:  Good  Impulse Control: Good   Risk Assessment: Danger to Self:  No Patient denied current suicidal ideation Self-injurious Behavior: No Danger to Others: No Patient denied current homicidal ideation Duty to Warn:no Physical Aggression / Violence:No  Access to Firearms a concern: No  Gang Involvement:No   Subjective: Patient stated, "I don't know why I'm having problems writing in that journal". Patient reported writing her thoughts and feelings down on paper "feels like it's real". Patient reported feeling some of the thoughts are irrational and feels if she puts the thoughts on paper the thoughts are permanent. Patient stated, "I have problems doing that". Patient reported she has boxes of documents that she is fearful of throwing away and stated, "its a childhood I can't get back" in response to discarding the documents. Patient reported the documents remind her of a happy time during her childhood. Patient stated, "I have a hard time throwing things away" and reported this is due to an emotional attachment from a happy time in her life. Patient stated, "I attach emotions to an inanimate object". Patient reported feeling if she remains attached to the past it will slow down the future or  "make it less painful". Patient reported fear of the future, fear of getting sick, fear of being incapacitated, and developing an illness like her father. Patient reported a fear that her thoughts "might have some basis" and stated, "my fear is that they're not, that something is going to come true". Patient stated, "when I answered the questions in my head, it helped" in response to questions to challenge thought distortions. Patient reported she utilized the challenging questions discussed during last session in response to a recent situation at work and stated, "it helped a lot".  Patient stated, "its always darkest before the dawn" is a phrase she uses in response to depressive symptoms. Patient reported a fear of becoming homeless with multiple animals that she can not care for and reported that fear drives her and has prompted patient's financial decisions. Patient reported her father's illness and the impact on her family is a trigger for patient's fear of becoming homeless. Patient stated,  "I never felt I could be care free" and stated, "I went from 13 to 39" in response to the impact of her father's illness.   Interventions: Cognitive Behavioral Therapy. Clinician conducted session via telephone from clinician's home office per patient's request. Patient provided verbal consent to proceed with telephonic session and participated in session from patient's home.  Reviewed patient's homework to complete thought record. Explored and identified barriers to completing homework. Explored patient's thoughts associated with feeling if she puts her thoughts in writing the thoughts are real and challenged irrational beliefs associated. Explored patient's thoughts/feelings connected to maintaining documents and fear of discarding  the documents. Explored patient's fear of the future and moving forward. Challenged thoughts distortions and irrational thoughts associated with excessive fears. Provided psycho education  related to putting thoughts on trial and continuing to challenge thought distortions. Discussed using positive affirmations to reframe thoughts into more positive thoughts. Explored thoughts associated with patient's fear of becoming homeless and the impact patient's father's illness has had on patient's thoughts, feelings, and behaviors. Clinician requested patient continue thought record and indicate a rational thought for each distortion, as well as, answer questions to challenge irrational thought for homework.     Diagnosis:  Generalized anxiety disorder     Plan: Patient is to utilize [employer] [facility], thought re-framing, relaxation techniques, mindfulness and coping strategies to decrease symptoms associated with Generalized Anxiety Disorder. Frequency: weekly  Modality: individual      Long-term goal:   Reduce overall level, frequency, and intensity of the symptoms of depression and anxiety as evidenced by decreased returning to bed in the mornings, scratching when anxious, crying, not wanting to leave the house, sadness, worry, fatigue, lack of energy, staying in bed frequently, difficulty staying asleep, decreased concentration, easily agitated, irritability, slight change in memory, feeling on edge, panic attacks, headaches, checking door locks multiple times when leaving the house, checking to ensure the water is off when leaving the house from 6 to 7 days per week to 2 days per week per patient's report for at least 6 consecutive months.   Target Date: 08/05/23  Progress: progressing    Short-term goal:  Increase participation in daily household activities, such as, washing dishes, dusting, household cleaning from 0 days per week to 2 times per week per patient's report   Target Date: 02/03/23  Progress: progressing    Increase participation in activities patient enjoys, such as, going to the gym, reading, going to [employer], spending time with her horses, or attending  a museum from 2 days per week to 4 days per week   Target Date: 02/03/23  Progress: progressing    Decrease daily level of anxiety and depressive symptoms by developing healthy coping mechanisms   Target Date: 02/03/23  Progress: progressing    Decrease level of anxiety surrounding retirement by developing a plan for post retirement    Target Date: 02/03/23  Progress: progressing     Katherina Right, LCSW

## 2022-10-02 NOTE — Progress Notes (Signed)
                Vencent Hauschild, LCSW 

## 2022-11-03 ENCOUNTER — Ambulatory Visit: Payer: BC Managed Care – PPO | Admitting: Clinical

## 2022-11-17 ENCOUNTER — Ambulatory Visit: Payer: BC Managed Care – PPO | Admitting: Clinical

## 2022-12-15 ENCOUNTER — Ambulatory Visit: Payer: BC Managed Care – PPO | Admitting: Clinical

## 2023-03-01 ENCOUNTER — Ambulatory Visit: Payer: BC Managed Care – PPO | Admitting: Internal Medicine

## 2023-04-09 ENCOUNTER — Other Ambulatory Visit: Payer: Self-pay | Admitting: Internal Medicine

## 2023-04-09 DIAGNOSIS — Z1231 Encounter for screening mammogram for malignant neoplasm of breast: Secondary | ICD-10-CM

## 2023-05-03 ENCOUNTER — Ambulatory Visit
Admission: RE | Admit: 2023-05-03 | Discharge: 2023-05-03 | Disposition: A | Discharge status: Home / Self Care | Payer: BC Managed Care – PPO | Source: Ambulatory Visit | Attending: Internal Medicine | Admitting: Internal Medicine

## 2023-05-03 DIAGNOSIS — Z1231 Encounter for screening mammogram for malignant neoplasm of breast: Secondary | ICD-10-CM | POA: Diagnosis not present

## 2023-05-23 ENCOUNTER — Encounter: Payer: Self-pay | Admitting: Internal Medicine

## 2023-05-23 ENCOUNTER — Telehealth: Payer: BC Managed Care – PPO | Admitting: Internal Medicine

## 2023-05-23 DIAGNOSIS — M810 Age-related osteoporosis without current pathological fracture: Secondary | ICD-10-CM

## 2023-05-23 DIAGNOSIS — R5383 Other fatigue: Secondary | ICD-10-CM

## 2023-05-23 DIAGNOSIS — G8929 Other chronic pain: Secondary | ICD-10-CM

## 2023-05-23 DIAGNOSIS — Z0289 Encounter for other administrative examinations: Secondary | ICD-10-CM

## 2023-05-23 DIAGNOSIS — U099 Post covid-19 condition, unspecified: Secondary | ICD-10-CM

## 2023-05-23 DIAGNOSIS — M25531 Pain in right wrist: Secondary | ICD-10-CM

## 2023-05-23 DIAGNOSIS — E782 Mixed hyperlipidemia: Secondary | ICD-10-CM

## 2023-05-23 NOTE — Assessment & Plan Note (Signed)
Okay to continue ibuprofen as needed

## 2023-05-23 NOTE — Assessment & Plan Note (Signed)
Will check lipid profile annual exam Encouraged low-fat diet Continue fish oil

## 2023-05-23 NOTE — Assessment & Plan Note (Signed)
Continue calcium and vitamin D OTC Encouraged daily weightbearing exercise 

## 2023-05-23 NOTE — Progress Notes (Signed)
Hi Bridget Burns virtual Visit via Video Note  I connected with Bridget Burns on 05/23/23 at  8:20 AM EDT by a video enabled telemedicine application and verified that I am speaking with the correct person using two identifiers.  Location: Patient: Home Provider: Home  Persons participating in this video call: Ovidio Hanger, NP and Bridget Burns   I discussed the limitations of evaluation and management by telemedicine and the availability of in person appointments. The patient expressed understanding and agreed to proceed.  History of Present Illness:  Patient due for follow-up of chronic conditions.  HLD: Her last LDL was 161, triglycerides 238, 06/2022.  She is taking fish oil OTC.  She tries to consume a low-fat diet.  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  She takes ibuprofen as needed with some relief of symptoms.  She no longer wear a brace for this.   Osteoporosis: She is taking calcium and vitamin D OTC.  She tries to get some weightbearing exercise then.  Bone density form 09/2022 reviewed.  She also reports exhaustion from having COVID 06/2022. She reports she is sleeping well at night, averaging about 8-9 hours per night.  She does feel drowsy first thing in the morning. She is having difficulty working because of the exhaustion. Monday's are good because she has rested all weekend. By Wednesday, she is so tired, she typically has to call into work. She used to work out before morning every day and now she is unable to workout at all. She would like a medical accomodation to be able to work from home. She is already allotted 5 days to work from home a month. She would like FMLA forms completed for a LOA from 8/13-10/13.   Past Medical History:  Diagnosis Date   History of colon polyps     Current Outpatient Medications  Medication Sig Dispense Refill   Ascorbic Acid (VITAMIN C) 1000 MG tablet Take 1,000 mg by mouth daily.     Calcium Carb-Cholecalciferol  (CALCIUM 600 + D PO) Take 1-2 tablets by mouth daily.     cholecalciferol (VITAMIN D3) 25 MCG (1000 UNIT) tablet Take 1,000 Units by mouth daily.     estradiol (ESTRACE) 0.1 MG/GM vaginal cream Place 1 Applicatorful vaginally 2 (two) times a week. 42.5 g 2   Omega-3 Fatty Acids (FISH OIL) 1000 MG CAPS Take 1 capsule by mouth daily.     zinc gluconate 50 MG tablet Take 50 mg by mouth daily.     No current facility-administered medications for this visit.    No Known Allergies  Family History  Problem Relation Age of Onset   Hypertension Mother    Other Father        Brain Tumor, benign   Pancreatic cancer Maternal Aunt        38?   Breast cancer Neg Hx     Social History   Socioeconomic History   Marital status: Single    Spouse name: Not on file   Number of children: Not on file   Years of education: Not on file   Highest education level: Not on file  Occupational History   Not on file  Tobacco Use   Smoking status: Never   Smokeless tobacco: Never  Vaping Use   Vaping status: Never Used  Substance and Sexual Activity   Alcohol use: No   Drug use: No   Sexual activity: Yes    Birth control/protection: Surgical    Comment: Hysterectomy  Other Topics Concern   Not on file  Social History Narrative   Not on file   Social Determinants of Health   Financial Resource Strain: Not on file  Food Insecurity: Not on file  Transportation Needs: Not on file  Physical Activity: Not on file  Stress: Not on file  Social Connections: Not on file  Intimate Partner Violence: Not on file     Constitutional: Patient reports fatigue.  Denies fever, malaise, headache or abrupt weight changes.  HEENT: Denies eye pain, eye redness, ear pain, ringing in the ears, wax buildup, runny nose, nasal congestion, bloody nose, or sore throat. Respiratory: Denies difficulty breathing, shortness of breath, cough or sputum production.   Cardiovascular: Denies chest pain, chest tightness,  palpitations or swelling in the hands or feet.  Gastrointestinal: Denies abdominal pain, bloating, constipation, diarrhea or blood in the stool.  GU: Denies urgency, frequency, pain with urination, burning sensation, blood in urine, odor or discharge. Musculoskeletal: Patient reports chronic right wrist pain.  Denies decrease in range of motion, difficulty with gait, muscle pain or joint swelling.  Skin: Denies redness, rashes, lesions or ulcercations.  Neurological: Denies dizziness, difficulty with memory, difficulty with speech or problems with balance and coordination.  Psych: Denies anxiety, depression, SI/HI.  No other specific complaints in a complete review of systems (except as listed in HPI above).   Observations/Objective:   Wt Readings from Last 3 Encounters:  06/29/22 150 lb (68 kg)  02/28/22 152 lb (68.9 kg)  05/12/21 153 lb 3.2 oz (69.5 kg)    General: Appears her stated age, in NAD. Pulmonary/Chest: Normal effort. No respiratory distress.  Neurological: Alert and oriented.      BMET    Component Value Date/Time   NA 141 06/29/2022 0835   NA 141 11/19/2020 1049   K 4.3 06/29/2022 0835   CL 106 06/29/2022 0835   CO2 26 06/29/2022 0835   GLUCOSE 101 (H) 06/29/2022 0835   BUN 21 06/29/2022 0835   BUN 19 11/19/2020 1049   CREATININE 0.78 06/29/2022 0835   CALCIUM 9.9 06/29/2022 0835   GFRNONAA 95 11/19/2020 1049   GFRAA 110 11/19/2020 1049    Lipid Panel     Component Value Date/Time   CHOL 234 (H) 06/29/2022 0835   CHOL 265 (H) 11/19/2020 1049   TRIG 238 (H) 06/29/2022 0835   HDL 33 (L) 06/29/2022 0835   HDL 39 (L) 11/19/2020 1049   CHOLHDL 7.1 (H) 06/29/2022 0835   VLDL 42.6 (H) 06/27/2016 0950   LDLCALC 161 (H) 06/29/2022 0835    CBC    Component Value Date/Time   WBC 7.2 06/29/2022 0835   RBC 4.90 06/29/2022 0835   HGB 15.2 06/29/2022 0835   HGB 15.0 11/03/2019 1115   HCT 44.7 06/29/2022 0835   HCT 43.9 11/03/2019 1115   PLT 391  06/29/2022 0835   PLT 377 11/03/2019 1115   MCV 91.2 06/29/2022 0835   MCV 90 11/03/2019 1115   MCH 31.0 06/29/2022 0835   MCHC 34.0 06/29/2022 0835   RDW 12.8 06/29/2022 0835   RDW 12.6 11/03/2019 1115    Hgb A1C Lab Results  Component Value Date   HGBA1C 5.4 05/12/2021        Assessment and Plan: Long COVID exhaustion:  Encourage adequate rest at night Try to avoid napping during the day Encourage as much exercise as you can get in We will check TSH, vitamin D and B12 at annual exam Will fill out FMLA  form completion per request  RTC in 1 month for your annual exam  Follow Up Instructions:    I discussed the assessment and treatment plan with the patient. The patient was provided an opportunity to ask questions and all were answered. The patient agreed with the plan and demonstrated an understanding of the instructions.   The patient was advised to call back or seek an in-person evaluation if the symptoms worsen or if the condition fails to improve as anticipated.     Nicki Reaper, NP

## 2023-05-23 NOTE — Patient Instructions (Signed)

## 2023-05-29 DIAGNOSIS — Z0279 Encounter for issue of other medical certificate: Secondary | ICD-10-CM

## 2023-06-07 DIAGNOSIS — H04123 Dry eye syndrome of bilateral lacrimal glands: Secondary | ICD-10-CM | POA: Diagnosis not present

## 2023-06-07 DIAGNOSIS — H43391 Other vitreous opacities, right eye: Secondary | ICD-10-CM | POA: Diagnosis not present

## 2023-06-07 DIAGNOSIS — Z9889 Other specified postprocedural states: Secondary | ICD-10-CM | POA: Diagnosis not present

## 2023-06-07 DIAGNOSIS — Z0100 Encounter for examination of eyes and vision without abnormal findings: Secondary | ICD-10-CM | POA: Diagnosis not present

## 2023-07-09 ENCOUNTER — Ambulatory Visit (INDEPENDENT_AMBULATORY_CARE_PROVIDER_SITE_OTHER): Payer: BC Managed Care – PPO | Admitting: Internal Medicine

## 2023-07-09 ENCOUNTER — Encounter: Payer: Self-pay | Admitting: Internal Medicine

## 2023-07-09 VITALS — BP 122/84 | HR 72 | Ht 66.0 in | Wt 154.0 lb

## 2023-07-09 DIAGNOSIS — Z23 Encounter for immunization: Secondary | ICD-10-CM | POA: Diagnosis not present

## 2023-07-09 DIAGNOSIS — E782 Mixed hyperlipidemia: Secondary | ICD-10-CM

## 2023-07-09 DIAGNOSIS — Z1211 Encounter for screening for malignant neoplasm of colon: Secondary | ICD-10-CM

## 2023-07-09 DIAGNOSIS — Z0001 Encounter for general adult medical examination with abnormal findings: Secondary | ICD-10-CM | POA: Diagnosis not present

## 2023-07-09 DIAGNOSIS — R739 Hyperglycemia, unspecified: Secondary | ICD-10-CM

## 2023-07-09 DIAGNOSIS — R7309 Other abnormal glucose: Secondary | ICD-10-CM | POA: Diagnosis not present

## 2023-07-09 NOTE — Progress Notes (Signed)
Subjective:    Patient ID: Bridget Burns, female    DOB: [birth year], 62 y.o.   MRN: [id number]  HPI  Patient presents to clinic today for her annual exam.  Flu: 06/2022 Tetanus: 06/2016 COVID: X 2 [facility] Pap smear: Hysterectomy Mammogram: 04/2023 Bone density: 09/2022 Colon screening: 12/2018 Vision screening: annually Dentist: biannually  Diet: She does eat some meat. She does consumes fruits and veggies. She tries to avoid fried foods. She drinks mostly dt soda, dt vitamin water. Exercise: gym 1 x day   Review of Systems  Past Medical History:  Diagnosis Date   History of colon polyps     Current Outpatient Medications  Medication Sig Dispense Refill   Ascorbic Acid (VITAMIN C) 1000 MG tablet Take 1,000 mg by mouth daily.     Calcium Carb-Cholecalciferol (CALCIUM 600 + D PO) Take 1-2 tablets by mouth daily.     cholecalciferol (VITAMIN D3) 25 MCG (1000 UNIT) tablet Take 1,000 Units by mouth daily.     estradiol (ESTRACE) 0.1 MG/GM vaginal cream Place 1 Applicatorful vaginally 2 (two) times a week. 42.5 g 2   Omega-3 Fatty Acids (FISH OIL) 1000 MG CAPS Take 1 capsule by mouth daily.     zinc gluconate 50 MG tablet Take 50 mg by mouth daily.     No current facility-administered medications for this visit.    No Known Allergies  Family History  Problem Relation Age of Onset   Hypertension Mother    Other Father        Brain Tumor, benign   Pancreatic cancer Maternal Aunt        51?   Breast cancer Neg Hx     Social History   Socioeconomic History   Marital status: Single    Spouse name: Not on file   Number of children: Not on file   Years of education: Not on file   Highest education level: Not on file  Occupational History   Not on file  Tobacco Use   Smoking status: Never   Smokeless tobacco: Never  Vaping Use   Vaping status: Never Used  Substance and Sexual Activity   Alcohol use: No   Drug use: No   Sexual activity: Yes     Birth control/protection: Surgical    Comment: Hysterectomy  Other Topics Concern   Not on file  Social History Narrative   Not on file   Social Determinants of Health   Financial Resource Strain: Not on file  Food Insecurity: Not on file  Transportation Needs: Not on file  Physical Activity: Not on file  Stress: Not on file  Social Connections: Not on file  Intimate Partner Violence: Not on file     Constitutional: Pt reports chronic fatigue. Denies fever, malaise, headache or abrupt weight changes.  HEENT: Denies eye pain, eye redness, ear pain, ringing in the ears, wax buildup, runny nose, nasal congestion, bloody nose, or sore throat. Respiratory: Denies difficulty breathing, shortness of breath, cough or sputum production.   Cardiovascular: Denies chest pain, chest tightness, palpitations or swelling in the hands or feet.  Gastrointestinal: Denies abdominal pain, bloating, constipation, diarrhea or blood in the stool.  GU: Denies urgency, frequency, pain with urination, burning sensation, blood in urine, odor or discharge. Musculoskeletal: Patient reports chronic wrist pain.  Denies decrease in range of motion, difficulty with gait, muscle pain or joint swelling.  Skin: Denies redness, rashes, lesions or ulcercations.  Neurological: Denies dizziness, difficulty  with memory, difficulty with speech or problems with balance and coordination.  Psych: Denies anxiety, depression, SI/HI.  No other specific complaints in a complete review of systems (except as listed in HPI above).     Objective:   Physical Exam   BP 122/84 (BP Location: Left Arm, Patient Position: Sitting, Cuff Size: Normal)   Pulse 72   Ht 5\' 6"  (1.676 m)   Wt 154 lb (69.9 kg)   LMP  (LMP Unknown) Comment: Hysterectomy  SpO2 96%   BMI 24.86 kg/m   Wt Readings from Last 3 Encounters:  06/29/22 150 lb (68 kg)  02/28/22 152 lb (68.9 kg)  05/12/21 153 lb 3.2 oz (69.5 kg)    General: Appears her stated  age, well developed, well nourished in NAD. Skin: Warm, dry and intact.  HEENT: Head: normal shape and size; Eyes: sclera white, no icterus, conjunctiva pink, PERRLA and EOMs intact;  Neck:  Neck supple, trachea midline. No masses, lumps or thyromegaly present.  Cardiovascular: Normal rate and rhythm. S1,S2 noted.  No murmur, rubs or gallops noted. No JVD or BLE edema. No carotid bruits noted. Pulmonary/Chest: Normal effort and positive vesicular breath sounds. No respiratory distress. No wheezes, rales or ronchi noted.  Abdomen: Soft and nontender. Normal bowel sounds.  Musculoskeletal: Strength 5/5 BUE/BLE. No difficulty with gait.  Neurological: Alert and oriented. Cranial nerves II-XII grossly intact. Coordination normal.  Psychiatric: Mood and affect normal. Behavior is normal. Judgment and thought content normal.    BMET    Component Value Date/Time   NA 141 06/29/2022 0835   NA 141 11/19/2020 1049   K 4.3 06/29/2022 0835   CL 106 06/29/2022 0835   CO2 26 06/29/2022 0835   GLUCOSE 101 (H) 06/29/2022 0835   BUN 21 06/29/2022 0835   BUN 19 11/19/2020 1049   CREATININE 0.78 06/29/2022 0835   CALCIUM 9.9 06/29/2022 0835   GFRNONAA 95 11/19/2020 1049   GFRAA 110 11/19/2020 1049    Lipid Panel     Component Value Date/Time   CHOL 234 (H) 06/29/2022 0835   CHOL 265 (H) 11/19/2020 1049   TRIG 238 (H) 06/29/2022 0835   HDL 33 (L) 06/29/2022 0835   HDL 39 (L) 11/19/2020 1049   CHOLHDL 7.1 (H) 06/29/2022 0835   VLDL 42.6 (H) 06/27/2016 0950   LDLCALC 161 (H) 06/29/2022 0835    CBC    Component Value Date/Time   WBC 7.2 06/29/2022 0835   RBC 4.90 06/29/2022 0835   HGB 15.2 06/29/2022 0835   HGB 15.0 11/03/2019 1115   HCT 44.7 06/29/2022 0835   HCT 43.9 11/03/2019 1115   PLT 391 06/29/2022 0835   PLT 377 11/03/2019 1115   MCV 91.2 06/29/2022 0835   MCV 90 11/03/2019 1115   MCH 31.0 06/29/2022 0835   MCHC 34.0 06/29/2022 0835   RDW 12.8 06/29/2022 0835   RDW 12.6  11/03/2019 1115    Hgb A1C Lab Results  Component Value Date   HGBA1C 5.4 05/12/2021           Assessment & Plan:   Preventative health maintenance:  Flu shot today Tetanus UTD Encouraged her to get her COVID booster Will request copy of [facility] vaccine [facility] She no longer needs to screen for cervical cancer Mammogram UTD Bone density UTD Referral to GI for screening colonoscopy Encouraged her to consume a balanced diet and exercise regimen Advised her to see an eye doctor and dentist annually We will check CBC, c-Met,  lipid, A1c today  RTC in 6 months, follow-up chronic conditions Nicki Reaper, NP

## 2023-07-09 NOTE — Patient Instructions (Signed)
Health Maintenance for Postmenopausal Women Menopause is a normal process in which your ability to get pregnant comes to an end. This process happens slowly over many months or years, usually between the ages of 92 and 41. Menopause is complete when you have missed your menstrual period for 12 months. It is important to talk with your health care provider about some of the most common conditions that affect women after menopause (postmenopausal women). These include heart disease, cancer, and bone loss (osteoporosis). Adopting a healthy lifestyle and getting preventive care can help to promote your health and wellness. The actions you take can also lower your chances of developing some of these common conditions. What are the signs and symptoms of menopause? During menopause, you may have the following symptoms: Hot flashes. These can be moderate or severe. Night sweats. Decrease in sex drive. Mood swings. Headaches. Tiredness (fatigue). Irritability. Memory problems. Problems falling asleep or staying asleep. Talk with your health care provider about treatment options for your symptoms. Do I need hormone replacement therapy? Hormone replacement therapy is effective in treating symptoms that are caused by menopause, such as hot flashes and night sweats. Hormone replacement carries certain risks, especially as you become older. If you are thinking about using estrogen or estrogen with progestin, discuss the benefits and risks with your health care provider. How can I reduce my risk for heart disease and stroke? The risk of heart disease, heart attack, and stroke increases as you age. One of the causes may be a change in the body's hormones during menopause. This can affect how your body uses dietary fats, triglycerides, and cholesterol. Heart attack and stroke are medical emergencies. There are many things that you can do to help prevent heart disease and stroke. Watch your blood pressure High  blood pressure causes heart disease and increases the risk of stroke. This is more likely to develop in people who have high blood pressure readings or are overweight. Have your blood pressure checked: Every 3-5 years if you are 52-67 years of age. Every year if you are 70 years old or older. Eat a healthy diet  Eat a diet that includes plenty of vegetables, fruits, low-fat dairy products, and lean protein. Do not eat a lot of foods that are high in solid fats, added sugars, or sodium. Get regular exercise Get regular exercise. This is one of the most important things you can do for your health. Most adults should: Try to exercise for at least 150 minutes each week. The exercise should increase your heart rate and make you sweat (moderate-intensity exercise). Try to do strengthening exercises at least twice each week. Do these in addition to the moderate-intensity exercise. Spend less time sitting. Even light physical activity can be beneficial. Other tips Work with your health care provider to achieve or maintain a healthy weight. Do not use any products that contain nicotine or tobacco. These products include cigarettes, chewing tobacco, and vaping devices, such as e-cigarettes. If you need help quitting, ask your health care provider. Know your numbers. Ask your health care provider to check your cholesterol and your blood sugar (glucose). Continue to have your blood tested as directed by your health care provider. Do I need screening for cancer? Depending on your health history and family history, you may need to have cancer screenings at different stages of your life. This may include screening for: Breast cancer. Cervical cancer. Lung cancer. Colorectal cancer. What is my risk for osteoporosis? After menopause, you may be  at increased risk for osteoporosis. Osteoporosis is a condition in which bone destruction happens more quickly than new bone creation. To help prevent osteoporosis or  the bone fractures that can happen because of osteoporosis, you may take the following actions: If you are 37-62 years old, get at least 1,000 mg of calcium and at least 600 international units (IU) of vitamin D per day. If you are older than age 28 but younger than age 36, get at least 1,200 mg of calcium and at least 600 international units (IU) of vitamin D per day. If you are older than age 51, get at least 1,200 mg of calcium and at least 800 international units (IU) of vitamin D per day. Smoking and drinking excessive alcohol increase the risk of osteoporosis. Eat foods that are rich in calcium and vitamin D, and do weight-bearing exercises several times each week as directed by your health care provider. How does menopause affect my mental health? Depression may occur at any age, but it is more common as you become older. Common symptoms of depression include: Feeling depressed. Changes in sleep patterns. Changes in appetite or eating patterns. Feeling an overall lack of motivation or enjoyment of activities that you previously enjoyed. Frequent crying spells. Talk with your health care provider if you think that you are experiencing any of these symptoms. General instructions See your health care provider for regular wellness exams and vaccines. This may include: Scheduling regular health, dental, and eye exams. Getting and maintaining your vaccines. These include: Influenza vaccine. Get this vaccine each year before the flu season begins. Pneumonia vaccine. Shingles vaccine. Tetanus, diphtheria, and pertussis (Tdap) booster vaccine. Your health care provider may also recommend other immunizations. Tell your health care provider if you have ever been abused or do not feel safe at home. Summary Menopause is a normal process in which your ability to get pregnant comes to an end. This condition causes hot flashes, night sweats, decreased interest in sex, mood swings, headaches, or lack  of sleep. Treatment for this condition may include hormone replacement therapy. Take actions to keep yourself healthy, including exercising regularly, eating a healthy diet, watching your weight, and checking your blood pressure and blood sugar levels. Get screened for cancer and depression. Make sure that you are up to date with all your vaccines. This information is not intended to replace advice given to you by your health care provider. Make sure you discuss any questions you have with your health care provider. Document Revised: 02/21/2021 Document Reviewed: 02/21/2021 Elsevier Patient Education  2024 [employer].

## 2023-07-10 LAB — CBC
HCT: 45.2 % — ABNORMAL HIGH (ref 35.0–45.0)
Hemoglobin: 15 g/dL (ref 11.7–15.5)
MCH: 31.2 pg (ref 27.0–33.0)
MCHC: 33.2 g/dL (ref 32.0–36.0)
MCV: 94 fL (ref 80.0–100.0)
MPV: 10.4 fL (ref 7.5–12.5)
Platelets: 334 10*3/uL (ref 140–400)
RBC: 4.81 10*6/uL (ref 3.80–5.10)
RDW: 12.6 % (ref 11.0–15.0)
WBC: 5.7 10*3/uL (ref 3.8–10.8)

## 2023-07-10 LAB — LIPID PANEL
Cholesterol: 232 mg/dL — ABNORMAL HIGH (ref <200)
HDL: 37 mg/dL — ABNORMAL LOW (ref >=50)
LDL Cholesterol (Calc): 157 mg/dL (calc) — ABNORMAL HIGH
Non-HDL Cholesterol (Calc): 195 mg/dL (calc) — ABNORMAL HIGH (ref <130)
Total CHOL/HDL Ratio: 6.3 (calc) — ABNORMAL HIGH (ref <5.0)
Triglycerides: 224 mg/dL — ABNORMAL HIGH (ref <150)

## 2023-07-10 LAB — COMPLETE METABOLIC PANEL WITH GFR
AG Ratio: 1.6 (calc) (ref 1.0–2.5)
ALT: 17 U/L (ref 6–29)
AST: 16 U/L (ref 10–35)
Albumin: 4.3 g/dL (ref 3.6–5.1)
Alkaline phosphatase (APISO): 87 U/L (ref 37–153)
BUN: 20 mg/dL (ref 7–25)
CO2: 27 mmol/L (ref 20–32)
Calcium: 9.5 mg/dL (ref 8.6–10.4)
Chloride: 108 mmol/L (ref 98–110)
Creat: 0.73 mg/dL (ref 0.50–1.05)
Globulin: 2.7 g/dL (calc) (ref 1.9–3.7)
Glucose, Bld: 96 mg/dL (ref 65–99)
Potassium: 4.1 mmol/L (ref 3.5–5.3)
Sodium: 143 mmol/L (ref 135–146)
Total Bilirubin: 1.1 mg/dL (ref 0.2–1.2)
Total Protein: 7 g/dL (ref 6.1–8.1)
eGFR: 92 mL/min/{1.73_m2} (ref >=60)

## 2023-07-10 LAB — HEMOGLOBIN A1C
Hgb A1c MFr Bld: 5.6 % of total Hgb (ref <5.7)
Mean Plasma Glucose: 114 mg/dL
eAG (mmol/L): 6.3 mmol/L

## 2023-07-11 DIAGNOSIS — Z0279 Encounter for issue of other medical certificate: Secondary | ICD-10-CM

## 2023-07-12 ENCOUNTER — Encounter: Payer: Self-pay | Admitting: Internal Medicine

## 2023-07-16 ENCOUNTER — Other Ambulatory Visit: Payer: Self-pay | Admitting: *Deleted

## 2023-07-16 ENCOUNTER — Telehealth: Payer: Self-pay | Admitting: *Deleted

## 2023-07-16 ENCOUNTER — Telehealth: Payer: Self-pay

## 2023-07-16 DIAGNOSIS — Z860100 Personal history of colon polyps, unspecified: Secondary | ICD-10-CM

## 2023-07-16 MED ORDER — NA SULFATE-K SULFATE-MG SULF 17.5-3.13-1.6 GM/177ML PO SOLN: 1.0000 | Freq: Once | ORAL | 0 refills | Status: AC

## 2023-07-16 NOTE — Addendum Note (Signed)
Addended by: Tawnya Crook on: 07/16/2023 03:41 PM   Modules accepted: Orders

## 2023-07-16 NOTE — Telephone Encounter (Signed)
Pt requesting call back to schedule colonoscopy.

## 2023-07-16 NOTE — Telephone Encounter (Signed)
Patient called back and she was told she could get a colonoscopy schedule and performed once a year.  Colonoscopy schedule on 08/16/2023 with Dr Tobi Bastos

## 2023-07-16 NOTE — Telephone Encounter (Signed)
Spoken to patient and patient is not due until 12/2023. Patient wanted to know if she can get it sooner. I have inform patient that she will need to ask her insurance.   Patient verbalized understanding.

## 2023-07-17 MED ORDER — NA SULFATE-K SULFATE-MG SULF 17.5-3.13-1.6 GM/177ML PO SOLN: 1.0000 | Freq: Once | ORAL | 0 refills | Status: AC

## 2023-07-17 NOTE — Telephone Encounter (Signed)
Will keep as schedule as it is okay with Dr Tobi Bastos.

## 2023-07-17 NOTE — Telephone Encounter (Signed)
Gastroenterology Pre-Procedure Review  Request Date: 08/16/2023 Requesting Physician: Dr. Tobi Bastos  PATIENT REVIEW QUESTIONS: The patient responded to the following health history questions as indicated:    1. Are you having any GI issues? no 2. Do you have a personal history of Polyps? yes (last colonoscopy was 12/20/2018 with Dr Maximino Greenland) 3. Do you have a family history of Colon Cancer or Polyps? no 4. Diabetes Mellitus? no 5. Joint replacements in the past 12 months?no 6. Major health problems in the past 3 months?no 7. Any artificial heart valves, MVP, or defibrillator?no    MEDICATIONS & ALLERGIES:    Patient reports the following regarding taking any anticoagulation/antiplatelet therapy:   Plavix, Coumadin, Eliquis, Xarelto, Lovenox, Pradaxa, Brilinta, or Effient? no Aspirin? no  Patient confirms/reports the following medications:  Current Outpatient Medications  Medication Sig Dispense Refill   Ascorbic Acid (VITAMIN C) 1000 MG tablet Take 1,000 mg by mouth daily.     Calcium Carb-Cholecalciferol (CALCIUM 600 + D PO) Take 1-2 tablets by mouth daily.     cholecalciferol (VITAMIN D3) 25 MCG (1000 UNIT) tablet Take 1,000 Units by mouth daily.     estradiol (ESTRACE) 0.1 MG/GM vaginal cream Place 1 Applicatorful vaginally 2 (two) times a week. 42.5 g 2   Omega-3 Fatty Acids (FISH OIL) 1000 MG CAPS Take 1 capsule by mouth daily.     zinc gluconate 50 MG tablet Take 50 mg by mouth daily.     No current facility-administered medications for this visit.    Patient confirms/reports the following allergies:  No Known Allergies  No orders of the defined types were placed in this encounter.   AUTHORIZATION INFORMATION Primary Insurance: 1D#: Group #:  Secondary Insurance: 1D#: Group #:  SCHEDULE INFORMATION: Date: 08/16/2023 Time: Location: [facility]

## 2023-07-17 NOTE — Telephone Encounter (Signed)
Patient insisted on scheduling her colonoscopy even though she is not due for a repeat until 12/2023.  Patient called me, 3 times on 07/16/2023 regarding scheduling her colonoscopy.  Patient called insurance and stated that her insurance rep told her that she can her colonoscopy completed once a year.  She insisted on scheduling this early which I have done for 08/16/2023.  Are you okay with this or should I have patient wait?

## 2023-08-09 ENCOUNTER — Ambulatory Visit: Payer: BC Managed Care – PPO | Admitting: Obstetrics and Gynecology

## 2023-08-16 ENCOUNTER — Ambulatory Visit: Payer: BC Managed Care – PPO | Admitting: Anesthesiology

## 2023-08-16 ENCOUNTER — Encounter
Admission: RE | Disposition: A | Discharge status: Home / Self Care | Payer: Self-pay | Source: Home / Self Care | Attending: Gastroenterology

## 2023-08-16 ENCOUNTER — Ambulatory Visit
Admission: RE | Admit: 2023-08-16 | Discharge: 2023-08-16 | Disposition: A | Discharge status: Home / Self Care | Payer: BC Managed Care – PPO | Attending: Gastroenterology | Admitting: Gastroenterology

## 2023-08-16 ENCOUNTER — Encounter: Payer: Self-pay | Admitting: Gastroenterology

## 2023-08-16 ENCOUNTER — Other Ambulatory Visit: Payer: Self-pay

## 2023-08-16 DIAGNOSIS — Z1211 Encounter for screening for malignant neoplasm of colon: Secondary | ICD-10-CM | POA: Diagnosis not present

## 2023-08-16 DIAGNOSIS — Z860100 Personal history of colon polyps, unspecified: Secondary | ICD-10-CM | POA: Diagnosis not present

## 2023-08-16 DIAGNOSIS — K5730 Diverticulosis of large intestine without perforation or abscess without bleeding: Secondary | ICD-10-CM

## 2023-08-16 DIAGNOSIS — Z860101 Personal history of adenomatous and serrated colon polyps: Secondary | ICD-10-CM | POA: Insufficient documentation

## 2023-08-16 DIAGNOSIS — K635 Polyp of colon: Secondary | ICD-10-CM | POA: Diagnosis not present

## 2023-08-16 DIAGNOSIS — Z90710 Acquired absence of both cervix and uterus: Secondary | ICD-10-CM | POA: Diagnosis not present

## 2023-08-16 DIAGNOSIS — D126 Benign neoplasm of colon, unspecified: Secondary | ICD-10-CM

## 2023-08-16 HISTORY — PX: COLONOSCOPY WITH PROPOFOL: SHX5780

## 2023-08-16 HISTORY — PX: POLYPECTOMY: SHX5525

## 2023-08-16 SURGERY — COLONOSCOPY WITH PROPOFOL
Anesthesia: General

## 2023-08-16 MED ORDER — PROPOFOL 10 MG/ML IV BOLUS
INTRAVENOUS | Status: DC | PRN
  Administered 2023-08-16: 50 mg via INTRAVENOUS

## 2023-08-16 MED ORDER — PROPOFOL 500 MG/50ML IV EMUL
INTRAVENOUS | Status: DC | PRN
  Administered 2023-08-16: 100 ug/kg/min via INTRAVENOUS

## 2023-08-16 MED ORDER — SODIUM CHLORIDE 0.9 % IV SOLN: INTRAVENOUS | Status: DC

## 2023-08-16 MED ORDER — LIDOCAINE HCL (CARDIAC) PF 100 MG/5ML IV SOSY
PREFILLED_SYRINGE | INTRAVENOUS | Status: DC | PRN
  Administered 2023-08-16: 60 mg via INTRAVENOUS

## 2023-08-16 MED ORDER — DEXMEDETOMIDINE HCL IN NACL 80 MCG/20ML IV SOLN
INTRAVENOUS | Status: AC
  Filled 2023-08-16: qty 20

## 2023-08-16 MED ORDER — LIDOCAINE HCL (PF) 2 % IJ SOLN
INTRAMUSCULAR | Status: AC
  Filled 2023-08-16: qty 5

## 2023-08-16 NOTE — Anesthesia Preprocedure Evaluation (Signed)
Anesthesia Evaluation  Patient identified by MRN, date of birth, ID band Patient awake    Reviewed: Allergy & Precautions, NPO status , Patient's Chart, lab work & pertinent test results  Airway Mallampati: III  TM Distance: <3 FB Neck ROM: full    Dental  (+) Teeth Intact   Pulmonary neg pulmonary ROS   Pulmonary exam normal breath sounds clear to auscultation       Cardiovascular Exercise Tolerance: Good negative cardio ROS Normal cardiovascular exam Rhythm:Regular     Neuro/Psych negative neurological ROS  negative psych ROS   GI/Hepatic negative GI ROS, Neg liver ROS,,,  Endo/Other  negative endocrine ROS    Renal/GU negative Renal ROS  negative genitourinary   Musculoskeletal negative musculoskeletal ROS (+)    Abdominal Normal abdominal exam  (+)   Peds negative pediatric ROS (+)  Hematology negative hematology ROS (+)   Anesthesia Other Findings Past Medical History: No date: History of colon polyps  Past Surgical History: 2015: ABDOMINAL HYSTERECTOMY     Comment:  total No date: APPENDECTOMY 12/20/2018: COLONOSCOPY WITH PROPOFOL; N/A     Comment:  Procedure: COLONOSCOPY WITH PROPOFOL;  Surgeon:               Pasty Spillers, MD;  Location: [facility] END[facility];                Service: Endoscopy;  Laterality: N/A; No date: WISDOM TOOTH EXTRACTION  BMI    Body Mass Index: 24.82 kg/m      Reproductive/Obstetrics negative OB ROS                             Anesthesia Physical Anesthesia Plan  ASA: 1  Anesthesia Plan: General   Post-op Pain Management:    Induction: Intravenous  PONV Risk Score and Plan: Propofol infusion and TIVA  Airway Management Planned: Natural Airway  Additional Equipment:   Intra-op Plan:   Post-operative Plan:   Informed Consent: I have reviewed the patients History and Physical, chart, labs and discussed the procedure including the  risks, benefits and alternatives for the proposed anesthesia with the patient or authorized representative who has indicated his/her understanding and acceptance.     Dental Advisory Given  Plan Discussed with: CRNA and Surgeon  Anesthesia Plan Comments:        Anesthesia Quick Evaluation

## 2023-08-16 NOTE — H&P (Signed)
Wyline Mood, MD [address], [address], [place], [place], [address] [address], [address], [place], [place], [address] Phone: [phone]  Fax: [phone]  Primary Care Physician:  Lorre Munroe, NP   Pre-Procedure History & Physical: HPI:  Tanette Armada is a 62 y.o. female is here for an colonoscopy.   Past Medical History:  Diagnosis Date   History of colon polyps     Past Surgical History:  Procedure Laterality Date   ABDOMINAL HYSTERECTOMY  2015   total   APPENDECTOMY     COLONOSCOPY WITH PROPOFOL N/A 12/20/2018   Procedure: COLONOSCOPY WITH PROPOFOL;  Surgeon: Pasty Spillers, MD;  Location: [facility] ENDOSCOPY;  Service: Endoscopy;  Laterality: N/A;   WISDOM TOOTH EXTRACTION      Prior to Admission medications   Medication Sig Start Date End Date Taking? Authorizing Provider  Ascorbic Acid (VITAMIN C) 1000 MG tablet Take 1,000 mg by mouth daily.   Yes [provider]  Calcium Carb-Cholecalciferol (CALCIUM 600 + D PO) Take 1-2 tablets by mouth daily.   Yes [provider]  cholecalciferol (VITAMIN D3) 25 MCG (1000 UNIT) tablet Take 1,000 Units by mouth daily.   Yes [provider]  Omega-3 Fatty Acids (FISH OIL) 1000 MG CAPS Take 1 capsule by mouth daily.   Yes [provider]  zinc gluconate 50 MG tablet Take 50 mg by mouth daily.   Yes [provider]  estradiol (ESTRACE) 0.1 MG/GM vaginal cream Place 1 Applicatorful vaginally 2 (two) times a week. 11/22/20   Vena Austria, MD    Allergies as of 07/16/2023   (No Known Allergies)    Family History  Problem Relation Age of Onset   Hypertension Mother    Other Father        Brain Tumor, benign   Pancreatic cancer Maternal Aunt        65?   Breast cancer Neg Hx     Social History   Socioeconomic History   Marital status: Single    Spouse name: Not on file   Number of children: Not on file   Years of education: Not on file   Highest education  level: Not on file  Occupational History   Not on file  Tobacco Use   Smoking status: Never   Smokeless tobacco: Never  Vaping Use   Vaping status: Never Used  Substance and Sexual Activity   Alcohol use: No   Drug use: No   Sexual activity: Yes    Birth control/protection: Surgical    Comment: Hysterectomy  Other Topics Concern   Not on file  Social History Narrative   Not on file   Social Determinants of Health   Financial Resource Strain: Not on file  Food Insecurity: Not on file  Transportation Needs: Not on file  Physical Activity: Not on file  Stress: Not on file  Social Connections: Not on file  Intimate Partner Violence: Not on file    Review of Systems: See HPI, otherwise negative ROS  Physical Exam: BP 122/72   Pulse 74   Temp (!) 97.4 F (36.3 C) (Temporal)   Resp 18   Ht 5\' 6"  (1.676 m)   Wt 69.8 kg   LMP  (LMP Unknown) Comment: Hysterectomy  SpO2 98%   BMI 24.82 kg/m  General:   Alert,  pleasant and cooperative in NAD Head:  Normocephalic and atraumatic. Neck:  Supple; no masses or thyromegaly. Lungs:  Clear throughout to  auscultation, normal respiratory effort.    Heart:  +S1, +S2, Regular rate and rhythm, No edema. Abdomen:  Soft, nontender and nondistended. Normal bowel sounds, without guarding, and without rebound.   Neurologic:  Alert and  oriented x4;  grossly normal neurologically.  Impression/Plan: Easther Galley is here for an colonoscopy to be performed for Screening colonoscopy average risk   Risks, benefits, limitations, and alternatives regarding  colonoscopy have been reviewed with the patient.  Questions have been answered.  All parties agreeable.   Wyline Mood, MD  08/16/2023, 8:07 AM

## 2023-08-16 NOTE — Op Note (Signed)
[facility] Gastroenterology Patient Name: Bridget Burns Procedure Date: 08/16/2023 8:44 AM MRN: [id number] Account #: [id number] Date of Birth: November 22, 1960 Admit Type: Outpatient Age: 62 Room: [facility] ENDO ROOM 2 Gender: Female Note Status: Finalized Instrument Name: Prentice Docker [id number] Procedure:             Colonoscopy Indications:           Surveillance: Personal history of adenomatous polyps                         on last colonoscopy > 5 years ago Providers:             Wyline Mood MD, MD Referring MD:          Wyline Mood MD, MD (Referring MD), Lorre Munroe                         (Referring MD) Medicines:             Monitored Anesthesia Care Complications:         No immediate complications. Procedure:             Pre-Anesthesia Assessment:                        - Prior to the procedure, a History and Physical was                         performed, and patient medications, allergies and                         sensitivities were reviewed. The patient's tolerance                         of previous anesthesia was reviewed.                        - The risks and benefits of the procedure and the                         sedation options and risks were discussed with the                         patient. All questions were answered and informed                         consent was obtained.                        - ASA Grade Assessment: II - A patient with mild                         systemic disease.                        After obtaining informed consent, the colonoscope was                         passed under direct vision. Throughout the procedure,                         the  patient's blood pressure, pulse, and oxygen                         saturations were monitored continuously. The                         Colonoscope was introduced through the anus and                         advanced to the the cecum, identified by the                          appendiceal orifice. The colonoscopy was performed                         with ease. The patient tolerated the procedure well.                         The quality of the bowel preparation was excellent.                         The ileocecal valve, appendiceal orifice, and rectum                         were photographed. Findings:      The perianal and digital rectal examinations were normal.      A 3 mm polyp was found in the proximal ascending colon. The polyp was       sessile. The polyp was removed with a cold biopsy forceps. Resection and       retrieval were complete.      Multiple small-mouthed diverticula were found in the left colon.      The exam was otherwise without abnormality on direct and retroflexion       views. Impression:            - One 3 mm polyp in the proximal ascending colon,                         removed with a cold biopsy forceps. Resected and                         retrieved.                        - Diverticulosis in the left colon.                        - The examination was otherwise normal on direct and                         retroflexion views. Recommendation:        - Discharge patient to home (with escort).                        - Resume previous diet.                        - Continue present medications.                        -  Await pathology results.                        - Repeat colonoscopy for surveillance based on                         pathology results. Procedure Code(s):     --- Professional ---                        [phone], Colonoscopy, flexible; with biopsy, single or                         multiple Diagnosis Code(s):     --- Professional ---                        Z86.010, Personal history of colonic polyps                        D12.2, Benign neoplasm of ascending colon                        K57.30, Diverticulosis of large intestine without                         perforation or abscess without bleeding CPT copyright 2022  American Medical Association. All rights reserved. The codes documented in this report are preliminary and upon coder review may  be revised to meet current compliance requirements. Wyline Mood, MD Wyline Mood MD, MD 08/16/2023 9:09:59 AM This report has been signed electronically. Number of Addenda: 0 Note Initiated On: 08/16/2023 8:44 AM Scope Withdrawal Time: 0 hours 10 minutes 3 seconds  Total Procedure Duration: 0 hours 13 minutes 37 seconds  Estimated Blood Loss:  Estimated blood loss: none.      [facility]

## 2023-08-16 NOTE — Transfer of Care (Signed)
Immediate Anesthesia Transfer of Care Note  Patient: Bridget Burns  Procedure(s) Performed: COLONOSCOPY WITH PROPOFOL  Patient Location: PACU  Anesthesia Type:General  Level of Consciousness: sedated  Airway & Oxygen Therapy: Patient Spontanous Breathing and Patient connected to nasal cannula oxygen  Post-op Assessment: Report given to RN and Post -op Vital signs reviewed and stable  Post vital signs: Reviewed and stable  Last Vitals:  Vitals Value Taken Time  BP 84/66 08/16/23 0911  Temp    Pulse 74 08/16/23 0911  Resp    SpO2 100 % 08/16/23 0911  Vitals shown include unfiled device data.  Last Pain:  Vitals:   08/16/23 0802  TempSrc: Temporal  PainSc: 0-No pain         Complications: No notable events documented.

## 2023-08-16 NOTE — Anesthesia Postprocedure Evaluation (Signed)
Anesthesia Post Note  Patient: Bridget Burns  Procedure(s) Performed: COLONOSCOPY WITH PROPOFOL POLYPECTOMY  Patient location during evaluation: PACU Anesthesia Type: General Level of consciousness: awake Pain management: satisfactory to patient Vital Signs Assessment: post-procedure vital signs reviewed and stable Respiratory status: spontaneous breathing Cardiovascular status: stable Anesthetic complications: no   No notable events documented.   Last Vitals:  Vitals:   08/16/23 0920 08/16/23 0930  BP: (!) 121/58 120/67  Pulse: 70 60  Resp:    Temp: (!) 36.2 C (!) 36.2 C  SpO2: 99% 100%    Last Pain:  Vitals:   08/16/23 0930  TempSrc: Temporal  PainSc: 0-No pain                 VAN STAVEREN,Warrick Llera

## 2023-08-17 LAB — SURGICAL PATHOLOGY

## 2023-08-20 ENCOUNTER — Encounter: Payer: Self-pay | Admitting: Gastroenterology

## 2024-01-08 ENCOUNTER — Ambulatory Visit: Payer: Self-pay | Admitting: Internal Medicine

## 2024-02-19 ENCOUNTER — Ambulatory Visit (INDEPENDENT_AMBULATORY_CARE_PROVIDER_SITE_OTHER): Admitting: Internal Medicine

## 2024-02-19 ENCOUNTER — Encounter: Payer: Self-pay | Admitting: Internal Medicine

## 2024-02-19 VITALS — BP 98/58 | Ht 66.0 in | Wt 155.2 lb

## 2024-02-19 DIAGNOSIS — U099 Post covid-19 condition, unspecified: Secondary | ICD-10-CM

## 2024-02-19 DIAGNOSIS — R5383 Other fatigue: Secondary | ICD-10-CM | POA: Diagnosis not present

## 2024-02-19 DIAGNOSIS — Z0289 Encounter for other administrative examinations: Secondary | ICD-10-CM

## 2024-02-19 DIAGNOSIS — R5382 Chronic fatigue, unspecified: Secondary | ICD-10-CM | POA: Diagnosis not present

## 2024-02-19 NOTE — Progress Notes (Unsigned)
 Subjective:    Patient ID: Bridget Burns, female    DOB: [birth year], 63 y.o.   MRN: [id number]  HPI  Discussed the use of AI scribe software for clinical note transcription with the patient, who gave verbal consent to proceed.   Bridget Burns is a 63 year old female with long COVID who presents with a flare-up of symptoms.  She is experiencing severe exhaustion and difficulty concentrating, which have significantly impacted her ability to work and maintain her usual activities. Previously, she managed to continue her activities despite the symptoms, but she is now struggling more than before.  Earlier this year, she had a respiratory illness, possibly the flu or COVID, followed by a persistent cough. Once the cough resolved, the current symptoms of fatigue and inattention remained. She has no current upper respiratory symptoms.  She describes a pattern of symptom recurrence approximately every six months, with the last episode occurring more than six months ago. During previous episodes, she was granted two months off work, which she found beneficial. She is now seeking an extended leave of three months to manage her symptoms.  When not experiencing a flare-up, she is able to maintain her routine, including walking or going to the gym. However, during flare-ups, she finds herself unable to engage in these activities, often returning to bed due to fatigue.  No joint pain, nausea, vomiting, diarrhea, or current upper respiratory symptoms. She wants to continue working until retirement, which is a few years away.       Review of Systems   Past Medical History:  Diagnosis Date   History of colon polyps     Current Outpatient Medications  Medication Sig Dispense Refill   Ascorbic Acid (VITAMIN C) 1000 MG tablet Take 1,000 mg by mouth daily.     Calcium Carb-Cholecalciferol (CALCIUM 600 + D PO) Take 1-2 tablets by mouth daily.     cholecalciferol (VITAMIN D3) 25 MCG (1000 UNIT)  tablet Take 1,000 Units by mouth daily.     estradiol  (ESTRACE ) 0.1 MG/GM vaginal cream Place 1 Applicatorful vaginally 2 (two) times a week. 42.5 g 2   Omega-3 Fatty Acids (FISH OIL) 1000 MG CAPS Take 1 capsule by mouth daily.     zinc gluconate 50 MG tablet Take 50 mg by mouth daily.     No current facility-administered medications for this visit.    No Known Allergies  Family History  Problem Relation Age of Onset   Hypertension Mother    Other Father        Brain Tumor, benign   Pancreatic cancer Maternal Aunt        36?   Breast cancer Neg Hx     Social History   Socioeconomic History   Marital status: Single    Spouse name: Not on file   Number of children: Not on file   Years of education: Not on file   Highest education level: Not on file  Occupational History   Not on file  Tobacco Use   Smoking status: Never   Smokeless tobacco: Never  Vaping Use   Vaping status: Never Used  Substance and Sexual Activity   Alcohol use: No   Drug use: No   Sexual activity: Yes    Birth control/protection: Surgical    Comment: Hysterectomy  Other Topics Concern   Not on file  Social History Narrative   Not on file   Social Drivers of Health   Financial Resource Strain: Not on file  Food Insecurity: Not on file  Transportation Needs: Not on file  Physical Activity: Not on file  Stress: Not on file  Social Connections: Not on file  Intimate Partner Violence: Not on file     Constitutional: Pt reports fatigue. Denies fever, malaise, headache or abrupt weight changes.  HEENT: Denies eye pain, eye redness, ear pain, ringing in the ears, wax buildup, runny nose, nasal congestion, bloody nose, or sore throat. Respiratory: Denies difficulty breathing, shortness of breath, cough or sputum production.   Cardiovascular: Denies chest pain, chest tightness, palpitations or swelling in the hands or feet.  Gastrointestinal: Denies abdominal pain, bloating, constipation, diarrhea  or blood in the stool.  GU: Denies urgency, frequency, pain with urination, burning sensation, blood in urine, odor or discharge. Musculoskeletal: Denies decrease in range of motion, difficulty with gait, muscle pain or joint pain and swelling.  Skin: Denies redness, rashes, lesions or ulcercations.  Neurological: Pt reports inattention. Denies dizziness, difficulty with memory, difficulty with speech or problems with balance and coordination.  Psych: Denies anxiety, depression, SI/HI.  No other specific complaints in a complete review of systems (except as listed in HPI above).      Objective:   Physical Exam BP (!) 98/58 (BP Location: Left Arm, Patient Position: Sitting, Cuff Size: Normal)   Ht 5\' 6"  (1.676 m)   Wt 155 lb 3.2 oz (70.4 kg)   LMP  (LMP Unknown) Comment: Hysterectomy  BMI 25.05 kg/m   Wt Readings from Last 3 Encounters:  08/16/23 153 lb 12.8 oz (69.8 kg)  07/09/23 154 lb (69.9 kg)  06/29/22 150 lb (68 kg)    General: Appears her stated age, well developed, well nourished in NAD. Skin: Warm, dry and intact. No rashes noted. HEENT: Head: normal shape and size; Eyes: sclera white, no icterus, conjunctiva pink, PERRLA and EOMs intact;   Neck:  Neck supple, trachea midline. No masses, lumps or thyromegaly present.  Cardiovascular: Normal rate and rhythm. S1,S2 noted.  No murmur, rubs or gallops noted.  Pulmonary/Chest: Normal effort and positive vesicular breath sounds. No respiratory distress. No wheezes, rales or ronchi noted.  Musculoskeletal: No difficulty with gait.  Neurological: Alert and oriented. Coordination normal.  Psychiatric: Mood and affect normal. Behavior is normal. Judgment and thought content normal.    BMET    Component Value Date/Time   NA 143 07/09/2023 0921   NA 141 11/19/2020 1049   K 4.1 07/09/2023 0921   CL 108 07/09/2023 0921   CO2 27 07/09/2023 0921   GLUCOSE 96 07/09/2023 0921   BUN 20 07/09/2023 0921   BUN 19 11/19/2020 1049    CREATININE 0.73 07/09/2023 0921   CALCIUM 9.5 07/09/2023 0921   GFRNONAA 95 11/19/2020 1049   GFRAA 110 11/19/2020 1049    Lipid Panel     Component Value Date/Time   CHOL 232 (H) 07/09/2023 0921   CHOL 265 (H) 11/19/2020 1049   TRIG 224 (H) 07/09/2023 0921   HDL 37 (L) 07/09/2023 0921   HDL 39 (L) 11/19/2020 1049   CHOLHDL 6.3 (H) 07/09/2023 0921   VLDL 42.6 (H) 06/27/2016 0950   LDLCALC 157 (H) 07/09/2023 0921    CBC    Component Value Date/Time   WBC 5.7 07/09/2023 0921   RBC 4.81 07/09/2023 0921   HGB 15.0 07/09/2023 0921   HGB 15.0 11/03/2019 1115   HCT 45.2 (H) 07/09/2023 0921   HCT 43.9 11/03/2019 1115   PLT 334 07/09/2023 0921   PLT 377 11/03/2019  1115   MCV 94.0 07/09/2023 0921   MCV 90 11/03/2019 1115   MCH 31.2 07/09/2023 0921   MCHC 33.2 07/09/2023 0921   RDW 12.6 07/09/2023 0921   RDW 12.6 11/03/2019 1115    Hgb A1C Lab Results  Component Value Date   HGBA1C 5.6 07/09/2023            Assessment & Plan:   Assessment and Plan    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 Previous episodes improved with rest and activity resumption. - Provide three months of medical leave starting Feb 21, 2024. - Complete and submit FMLA paperwork upon receipt. - Discuss potential referral to long COVID clinic at [facility] if symptoms persist or worsen.       RTC in 4 months for your annual exam Helayne Lo, NP

## 2024-02-20 ENCOUNTER — Encounter: Payer: Self-pay | Admitting: Internal Medicine

## 2024-02-21 NOTE — Telephone Encounter (Signed)
 Forms printed and gave to Regina,NP

## 2024-02-27 ENCOUNTER — Encounter: Payer: Self-pay | Admitting: Internal Medicine

## 2024-02-28 NOTE — Telephone Encounter (Signed)
Will address upon my return on Monday

## 2024-03-03 DIAGNOSIS — Z0279 Encounter for issue of other medical certificate: Secondary | ICD-10-CM

## 2024-03-06 ENCOUNTER — Telehealth: Payer: Self-pay

## 2024-03-06 NOTE — Telephone Encounter (Signed)
 Copied from CRM [id number]. Topic: General - Other >> Mar 06, 2024  8:12 AM Bambi Bonine D wrote: Reason for CRM: Pt stated that she spoke with Mylinda Asa yesterday to have her FMLA papers faxed. Pt stated that they informed her that they haven't received the documents yet. Pt would like to have the office refax the FMLA papers to [phone] and notify her in MyChart when its completed.

## 2024-03-21 ENCOUNTER — Other Ambulatory Visit: Payer: Self-pay | Admitting: Internal Medicine

## 2024-03-21 DIAGNOSIS — Z1231 Encounter for screening mammogram for malignant neoplasm of breast: Secondary | ICD-10-CM

## 2024-04-17 ENCOUNTER — Ambulatory Visit: Admitting: Internal Medicine

## 2024-04-17 ENCOUNTER — Encounter: Payer: Self-pay | Admitting: Internal Medicine

## 2024-04-17 VITALS — BP 110/62 | HR 76 | Ht 66.0 in | Wt 155.4 lb

## 2024-04-17 DIAGNOSIS — Z0289 Encounter for other administrative examinations: Secondary | ICD-10-CM | POA: Diagnosis not present

## 2024-04-17 DIAGNOSIS — U099 Post covid-19 condition, unspecified: Secondary | ICD-10-CM | POA: Insufficient documentation

## 2024-04-17 NOTE — Patient Instructions (Signed)
Weakness Weakness is a lack of strength. You may feel weak all over your body (generalized), or you may feel weak in one part of your body (focal). Common causes of weakness include: Infection and disorders of the body's defense system (immune system). Physical exhaustion. Internal bleeding or other blood loss that results in a lack of red blood cells (anemia). Dehydration. An imbalance in mineral (electrolyte) levels, such as potassium. Chronic kidney or liver disease. Cancer. Other causes include: Some medicines or cancer treatment. Stress, anxiety, or depression. Heart disease, circulation problems, or stroke. Nervous system disorders. Thyroid disorders. Loss of muscle strength because of age or inactivity. Poor sleep quality or sleep disorders. The cause of your weakness may not be known. Some causes of weakness can be serious, so it is important to see your health care provider. Follow these instructions at home: Activity Rest as needed. Try to get enough sleep. Most adults need 7-8 hours of quality sleep each night. Talk to your health care provider about how much sleep you need. Do exercises, such as arm curls and leg raises, for 30 minutes at least 2 days a week or as told by your health care provider. This helps build muscle strength. Consider working with a physical therapist or trainer who can develop an exercise plan to help you gain muscle strength. General instructions  Take over-the-counter and prescription medicines only as told by your health care provider. Eat a healthy, well-balanced diet. This includes: Proteins to build muscles, such as lean meats and fish. Fresh fruits and vegetables. Carbohydrates to boost energy, such as whole grains. Drink enough fluid to keep your urine pale yellow. Keep all follow-up visits. This is important. Contact a health care provider if: Your weakness does not improve or gets worse. Your weakness affects your ability to think  clearly. Your weakness affects your ability to do your normal daily activities. Get help right away if: You develop sudden weakness, especially on one side of your face or body. You have chest pain. You have trouble breathing or shortness of breath. You have problems with your vision. You have trouble talking or swallowing. You have trouble standing or walking. You are light-headed or lose consciousness. These symptoms may be an emergency. Get help right away. Call 911. Do not wait to see if the symptoms will go away. Do not drive yourself to the hospital. Summary Weakness is a lack of strength. You may feel weak all over your body or just in one specific part of your body. Weakness can be caused by a variety of things. In some cases, the cause may be unknown. Rest as needed, and try to get enough sleep. Most adults need 7-8 hours of quality sleep each night. Eat a healthy, well-balanced diet. This information is not intended to replace advice given to you by your health care provider. Make sure you discuss any questions you have with your health care provider. Document Revised: 09/04/2021 Document Reviewed: 09/04/2021 Elsevier Patient Education  2024 [employer].

## 2024-04-17 NOTE — Progress Notes (Signed)
 Subjective:    Patient ID: Bridget Burns, female    DOB: 10-03-1961, 63 y.o.   MRN: [id number]  HPI   Discussed the use of AI scribe software for clinical note transcription with the patient, who gave verbal consent to proceed.  Bridget Burns is a 63 year old female who presents with persistent fatigue and brain fog following long COVID.  She has been experiencing persistent fatigue and brain fog since a COVID-19 infection in September 2023. Despite two months of rest, her symptoms have only slightly improved. She describes her condition as 'exhaustion' and spends a significant amount of time in bed due to fatigue. She is currently on leave from work and was initially scheduled to return on May 23, 2024, but is considering extending her leave due to ongoing symptoms.  She attempts to engage in physical activity by going to the gym, where she walks on the treadmill and uses light weights, but finds herself too tired to run. She describes feeling weak but is trying to maintain some level of activity.  No upper respiratory symptoms, chest pain, shortness of breath, nausea, vomiting, diarrhea, or joint pain. Her primary complaints remain weakness, fatigue, brain fog, and attention difficulties.    Review of Systems   Past Medical History:  Diagnosis Date   History of colon polyps     Current Outpatient Medications  Medication Sig Dispense Refill   Ascorbic Acid (VITAMIN C) 1000 MG tablet Take 1,000 mg by mouth daily.     Calcium Carb-Cholecalciferol (CALCIUM 600 + D PO) Take 1-2 tablets by mouth daily.     cholecalciferol (VITAMIN D3) 25 MCG (1000 UNIT) tablet Take 1,000 Units by mouth daily.     estradiol  (ESTRACE ) 0.1 MG/GM vaginal cream Place 1 Applicatorful vaginally 2 (two) times a week. 42.5 g 2   Omega-3 Fatty Acids (FISH OIL) 1000 MG CAPS Take 1 capsule by mouth daily.     zinc gluconate 50 MG tablet Take 50 mg by mouth daily.     No current facility-administered  medications for this visit.    No Known Allergies  Family History  Problem Relation Age of Onset   Hypertension Mother    Other Father        Brain Tumor, benign   Pancreatic cancer Maternal Aunt        49?   Breast cancer Neg Hx     Social History   Socioeconomic History   Marital status: Single    Spouse name: Not on file   Number of children: Not on file   Years of education: Not on file   Highest education level: Not on file  Occupational History   Not on file  Tobacco Use   Smoking status: Never   Smokeless tobacco: Never  Vaping Use   Vaping status: Never Used  Substance and Sexual Activity   Alcohol use: No   Drug use: No   Sexual activity: Yes    Birth control/protection: Surgical    Comment: Hysterectomy  Other Topics Concern   Not on file  Social History Narrative   Not on file   Social Drivers of Health   Financial Resource Strain: Not on file  Food Insecurity: Not on file  Transportation Needs: Not on file  Physical Activity: Not on file  Stress: Not on file  Social Connections: Not on file  Intimate Partner Violence: Not on file     Constitutional: Pt reports fatigue. Denies fever, malaise, headache or abrupt weight  changes.  HEENT: Denies eye pain, eye redness, ear pain, ringing in the ears, wax buildup, runny nose, nasal congestion, bloody nose, or sore throat. Respiratory: Denies difficulty breathing, shortness of breath, cough or sputum production.   Cardiovascular: Denies chest pain, chest tightness, palpitations or swelling in the hands or feet.  Gastrointestinal: Denies abdominal pain, bloating, constipation, diarrhea or blood in the stool.  GU: Denies urgency, frequency, pain with urination, burning sensation, blood in urine, odor or discharge. Musculoskeletal: Pt reports generalized weakness. Denies decrease in range of motion, difficulty with gait, muscle pain or joint pain and swelling.  Skin: Denies redness, rashes, lesions or  ulcercations.  Neurological: Pt reports inattention, brain fog. Denies dizziness, difficulty with memory, difficulty with speech or problems with balance and coordination.  Psych: Denies anxiety, depression, SI/HI.  No other specific complaints in a complete review of systems (except as listed in HPI above).      Objective:   Physical Exam BP 110/62 (BP Location: Left Arm, Patient Position: Sitting, Cuff Size: Normal)   Pulse 76   Ht 5' 6 (1.676 m)   Wt 155 lb 6.4 oz (70.5 kg)   LMP  (LMP Unknown) Comment: Hysterectomy  SpO2 99%   BMI 25.08 kg/m    Wt Readings from Last 3 Encounters:  02/19/24 155 lb 3.2 oz (70.4 kg)  08/16/23 153 lb 12.8 oz (69.8 kg)  07/09/23 154 lb (69.9 kg)    General: Appears her stated age, overweight, in NAD. Skin: Warm, dry and intact. No rashes noted. Cardiovascular: Normal rate and rhythm. S1,S2 noted.  No murmur, rubs or gallops noted.  Pulmonary/Chest: Normal effort and positive vesicular breath sounds. No respiratory distress. No wheezes, rales or ronchi noted.  Musculoskeletal: No difficulty with gait but pace is slowed.  Neurological: Alert and oriented. Coordination normal.  Psychiatric: Mood and affect normal. Behavior is normal. Judgment and thought content normal.    BMET    Component Value Date/Time   NA 143 07/09/2023 0921   NA 141 11/19/2020 1049   K 4.1 07/09/2023 0921   CL 108 07/09/2023 0921   CO2 27 07/09/2023 0921   GLUCOSE 96 07/09/2023 0921   BUN 20 07/09/2023 0921   BUN 19 11/19/2020 1049   CREATININE 0.73 07/09/2023 0921   CALCIUM 9.5 07/09/2023 0921   GFRNONAA 95 11/19/2020 1049   GFRAA 110 11/19/2020 1049    Lipid Panel     Component Value Date/Time   CHOL 232 (H) 07/09/2023 0921   CHOL 265 (H) 11/19/2020 1049   TRIG 224 (H) 07/09/2023 0921   HDL 37 (L) 07/09/2023 0921   HDL 39 (L) 11/19/2020 1049   CHOLHDL 6.3 (H) 07/09/2023 0921   VLDL 42.6 (H) 06/27/2016 0950   LDLCALC 157 (H) 07/09/2023 0921     CBC    Component Value Date/Time   WBC 5.7 07/09/2023 0921   RBC 4.81 07/09/2023 0921   HGB 15.0 07/09/2023 0921   HGB 15.0 11/03/2019 1115   HCT 45.2 (H) 07/09/2023 0921   HCT 43.9 11/03/2019 1115   PLT 334 07/09/2023 0921   PLT 377 11/03/2019 1115   MCV 94.0 07/09/2023 0921   MCV 90 11/03/2019 1115   MCH 31.2 07/09/2023 0921   MCHC 33.2 07/09/2023 0921   RDW 12.6 07/09/2023 0921   RDW 12.6 11/03/2019 1115    Hgb A1C Lab Results  Component Value Date   HGBA1C 5.6 07/09/2023            Assessment &  Plan:  Assessment and Plan    Long COVID exhaustion Persistent fatigue, brain fog, and weakness since September 2023 post-COVID-19 infection. Slight improvement with rest, but symptoms persist. On leave from work, engaging in light exercise. Plans to return to work on August 8th pending evaluation. - Complete additional forms for extended leave. - Encourage continued light exercise as tolerated. - Follow up in September for a physical examination.     RTC in 2 months for your annual exam Angeline Laura, NP

## 2024-04-23 ENCOUNTER — Ambulatory Visit: Admitting: Internal Medicine

## 2024-04-23 ENCOUNTER — Encounter: Payer: Self-pay | Admitting: Internal Medicine

## 2024-04-25 NOTE — Telephone Encounter (Signed)
 Will address on Monday upon my return

## 2024-05-05 ENCOUNTER — Ambulatory Visit
Admission: RE | Admit: 2024-05-05 | Discharge: 2024-05-05 | Disposition: A | Discharge status: Home / Self Care | Source: Ambulatory Visit | Attending: Internal Medicine | Admitting: Internal Medicine

## 2024-05-05 DIAGNOSIS — Z1231 Encounter for screening mammogram for malignant neoplasm of breast: Secondary | ICD-10-CM | POA: Insufficient documentation

## 2024-05-24 ENCOUNTER — Ambulatory Visit
Admission: EM | Admit: 2024-05-24 | Discharge: 2024-05-24 | Disposition: A | Discharge status: Home / Self Care | Attending: Emergency Medicine | Admitting: Emergency Medicine

## 2024-05-24 DIAGNOSIS — N390 Urinary tract infection, site not specified: Secondary | ICD-10-CM | POA: Diagnosis not present

## 2024-05-24 DIAGNOSIS — R319 Hematuria, unspecified: Secondary | ICD-10-CM | POA: Diagnosis not present

## 2024-05-24 LAB — POCT URINE DIPSTICK
Glucose, UA: NEGATIVE mg/dL
Ketones, POC UA: NEGATIVE mg/dL
Nitrite, UA: POSITIVE — AB
POC PROTEIN,UA: 100 — AB
Spec Grav, UA: 1.025 (ref 1.010–1.025)
Urobilinogen, UA: NEGATIVE U/dL — AB
pH, UA: 6 (ref 5.0–8.0)

## 2024-05-24 MED ORDER — CEPHALEXIN 500 MG PO CAPS: 500.0000 mg | ORAL_CAPSULE | Freq: Three times a day (TID) | ORAL | 0 refills | Status: AC

## 2024-05-24 NOTE — ED Provider Notes (Signed)
 CAY RALPH PELT    CSN: [id number] Arrival date & time: 05/24/24  1328      History   Chief Complaint No chief complaint on file.   HPI Courtnei Ruddell is a 63 y.o. female.  Patient presents with dysuria and frank hematuria since this morning.  She denies fever, chills, abdominal pain.  No OTC medications taken.  Her medical history includes hysterectomy.  The history is provided by the patient and medical records.    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/12/2021    Past Surgical History:  Procedure Laterality Date   ABDOMINAL HYSTERECTOMY  2015   total   APPENDECTOMY     COLONOSCOPY WITH PROPOFOL  N/A 12/20/2018   Procedure: COLONOSCOPY WITH PROPOFOL ;  Surgeon: Janalyn Keene NOVAK, MD;  Location: [facility] ENDOSCOPY;  Service: Endoscopy;  Laterality: N/A;   COLONOSCOPY WITH PROPOFOL  N/A 08/16/2023   Procedure: COLONOSCOPY WITH PROPOFOL ;  Surgeon: Therisa Bi, MD;  Location: [facility] ENDOSCOPY;  Service: Gastroenterology;  Laterality: N/A;   POLYPECTOMY  08/16/2023   Procedure: POLYPECTOMY;  Surgeon: Therisa Bi, MD;  Location: [facility] ENDOSCOPY;  Service: Gastroenterology;;   SHELLEE TOOTH EXTRACTION      OB History     Gravida  0   Para  0   Term  0   Preterm  0   AB  0   Living  0      SAB  0   IAB  0   Ectopic  0   Multiple  0   Live Births  0            Home Medications    Prior to Admission medications   Medication Sig Start Date End Date Taking? Authorizing Provider  cephALEXin  (KEFLEX ) 500 MG capsule Take 1 capsule (500 mg total) by mouth 3 (three) times daily for 7 days. 05/24/24 05/31/24 Yes Corlis Burnard DEL, NP  Ascorbic Acid (VITAMIN C) 1000 MG tablet Take 1,000 mg by mouth daily.     [provider]  Calcium Carb-Cholecalciferol (CALCIUM 600 + D PO) Take 1-2 tablets by mouth daily.    [provider]  cholecalciferol (VITAMIN D3) 25 MCG (1000 UNIT) tablet Take 1,000 Units by mouth daily.    [provider]  estradiol  (ESTRACE ) 0.1 MG/GM vaginal cream Place 1 Applicatorful vaginally 2 (two) times a week. 11/22/20   Lake Read, MD  Omega-3 Fatty Acids (FISH OIL) 1000 MG CAPS Take 1 capsule by mouth daily.    [provider]  zinc gluconate 50 MG tablet Take 50 mg by mouth daily.    [provider]    Family History Family History  Problem Relation Age of Onset   Hypertension Mother    Other Father        Brain Tumor, benign   Pancreatic cancer Maternal Aunt        76?   Breast cancer Neg Hx     Social History Social History   Tobacco Use   Smoking status: Never   Smokeless tobacco: Never  Vaping Use   Vaping status: Never Used  Substance Use Topics   Alcohol use: No   Drug use: No     Allergies   Patient has no known  allergies.   Review of Systems Review of Systems  Constitutional:  Negative for chills and fever.  Gastrointestinal:  Negative for abdominal pain.  Genitourinary:  Positive for dysuria and hematuria. Negative for flank pain.     Physical Exam Triage Vital Signs ED Triage Vitals  Encounter Vitals Group     BP      Girls Systolic BP Percentile      Girls Diastolic BP Percentile      Boys Systolic BP Percentile      Boys Diastolic BP Percentile      Pulse      Resp      Temp      Temp src      SpO2      Weight      Height      Head Circumference      Peak Flow      Pain Score      Pain Loc      Pain Education      Exclude from Growth Chart    No data found.  Updated Vital Signs BP 135/82   Pulse 79   Temp 97.7 F (36.5 C)   Resp 18   LMP  (LMP Unknown) Comment: Hysterectomy  SpO2 97%   Visual Acuity Right Eye Distance:   Left Eye Distance:   Bilateral  Distance:    Right Eye Near:   Left Eye Near:    Bilateral Near:     Physical Exam Constitutional:      General: She is not in acute distress. HENT:     Mouth/Throat:     Mouth: Mucous membranes are moist.  Cardiovascular:     Rate and Rhythm: Normal rate and regular rhythm.  Pulmonary:     Effort: Pulmonary effort is normal. No respiratory distress.  Abdominal:     General: Bowel sounds are normal.     Palpations: Abdomen is soft.     Tenderness: There is no abdominal tenderness. There is no right CVA tenderness, left CVA tenderness, guarding or rebound.  Neurological:     Mental Status: She is alert.      UC Treatments / Results  Labs (all labs ordered are listed, but only abnormal results are displayed) Labs Reviewed  POCT URINE DIPSTICK - Abnormal; Notable for the following components:      Result Value   Color, UA red (*)    Clarity, UA cloudy (*)    Bilirubin, UA small (*)    Blood, UA large (*)    POC PROTEIN,UA =100 (*)    Urobilinogen, UA negative (*)    Nitrite, UA Positive (*)    Leukocytes, UA Moderate (2+) (*)    All other components within normal limits  URINE CULTURE    EKG   Radiology No results found.  Procedures Procedures (including critical care time)  Medications Ordered in UC Medications - No data to display  Initial Impression / Assessment and Plan / UC Course  I have reviewed the triage vital signs and the nursing notes.  Pertinent labs & imaging results that were available during my care of the patient were reviewed by me and considered in my medical decision making (see chart for details).   Urinary tract infection with frank hematuria.  Treating today with 7-day course of cephalexin .  Urine culture pending.  Instructed patient to follow-up with her PCP on Monday to schedule a recheck of her urine next week.  ED precautions given.  Education  provided on UTI and hematuria.  She agrees to plan of care.    Final Clinical  Impressions(s) / UC Diagnoses   Final diagnoses:  Urinary tract infection with hematuria, site unspecified     Discharge Instructions      Take the antibiotic as directed.  The urine culture is pending.  We will call you if it shows the need to change or discontinue your antibiotic.    Follow up with your primary care provider on Monday.  Go to the emergency department if you have worsening symptoms.        ED Prescriptions     Medication Sig Dispense Auth. Provider   cephALEXin  (KEFLEX ) 500 MG capsule Take 1 capsule (500 mg total) by mouth 3 (three) times daily for 7 days. 21 capsule Corlis Burnard DEL, NP      PDMP not reviewed this encounter.   Corlis Burnard DEL, NP 05/24/24 1400

## 2024-05-24 NOTE — Discharge Instructions (Addendum)
 Take the antibiotic as directed.  The urine culture is pending.  We will call you if it shows the need to change or discontinue your antibiotic.    Follow up with your primary care provider on Monday.  Go to the emergency department if you have worsening symptoms.

## 2024-05-26 ENCOUNTER — Ambulatory Visit (HOSPITAL_COMMUNITY): Payer: Self-pay

## 2024-05-26 LAB — URINE CULTURE: Culture: 60000 — AB

## 2024-05-27 ENCOUNTER — Ambulatory Visit: Admitting: Internal Medicine

## 2024-05-27 ENCOUNTER — Encounter: Payer: Self-pay | Admitting: Internal Medicine

## 2024-05-27 VITALS — BP 124/82 | Ht 66.0 in | Wt 156.8 lb

## 2024-05-27 DIAGNOSIS — N3001 Acute cystitis with hematuria: Secondary | ICD-10-CM | POA: Diagnosis not present

## 2024-05-27 NOTE — Progress Notes (Signed)
 Subjective:    Patient ID: Bridget Burns, female    DOB: 1960-11-10, 63 y.o.   MRN: [id number]  HPI  Discussed the use of AI scribe software for clinical note transcription with the patient, who gave verbal consent to proceed.   Bridget Burns is a 63 year old female who presents with urinary symptoms following a recent urinary tract infection.  She visited urgent care on August 9th due to urinary symptoms. A urinalysis indicated a urinary tract infection, and she was prescribed keflex  500 mg three times a day for seven days. A urine culture revealed 60,000 colonies of E. coli sensitive to the prescribed antibiotic, so no changes were made to her treatment.  She experienced significant hematuria, prompting a follow-up with her primary care provider. The hematuria has since resolved. No urgency, frequency, burning, pain, back pain, fever, chills, nausea, or vomiting.   She recalls the last urinary tract infection occurred approximately eight years ago, prior to establishing care with her current provider. She has not had frequent UTIs in the past.       Review of Systems   Past Medical History:  Diagnosis Date   History of colon polyps     Current Outpatient Medications  Medication Sig Dispense Refill   Ascorbic Acid (VITAMIN C) 1000 MG tablet Take 1,000 mg by mouth daily.     Calcium Carb-Cholecalciferol (CALCIUM 600 + D PO) Take 1-2 tablets by mouth daily.     cephALEXin  (KEFLEX ) 500 MG capsule Take 1 capsule (500 mg total) by mouth 3 (three) times daily for 7 days. 21 capsule 0   cholecalciferol (VITAMIN D3) 25 MCG (1000 UNIT) tablet Take 1,000 Units by mouth daily.     estradiol  (ESTRACE ) 0.1 MG/GM vaginal cream Place 1 Applicatorful vaginally 2 (two) times a week. 42.5 g 2   Omega-3 Fatty Acids (FISH OIL) 1000 MG CAPS Take 1 capsule by mouth daily.     zinc gluconate 50 MG tablet Take 50 mg by mouth daily.     No current facility-administered medications for this visit.     No Known Allergies  Family History  Problem Relation Age of Onset   Hypertension Mother    Other Father        Brain Tumor, benign   Pancreatic cancer Maternal Aunt        24?   Breast cancer Neg Hx     Social History   Socioeconomic History   Marital status: Single    Spouse name: Not on file   Number of children: Not on file   Years of education: Not on file   Highest education level: Bachelor's degree (e.g., BA, AB, BS)  Occupational History   Not on file  Tobacco Use   Smoking status: Never   Smokeless tobacco: Never  Vaping Use   Vaping status: Never Used  Substance and Sexual Activity   Alcohol use: No   Drug use: No   Sexual activity: Yes    Birth control/protection: Surgical    Comment: Hysterectomy  Other Topics Concern   Not on file  Social History Narrative   Not on file   Social Drivers of Health   Financial Resource Strain: Low Risk  (04/17/2024)   Overall Financial Resource Strain (CARDIA)    Difficulty of Paying Living Expenses: Not hard at all  Food Insecurity: No Food Insecurity (04/17/2024)   Hunger Vital Sign    Worried About Running Out of Food in the Last Year: Never true  Ran Out of Food in the Last Year: Never true  Transportation Needs: No Transportation Needs (04/17/2024)   PRAPARE - Administrator, Civil Service (Medical): No    Lack of Transportation (Non-Medical): No  Physical Activity: Sufficiently Active (04/17/2024)   Exercise Vital Sign    Days of Exercise per Week: 5 days    Minutes of Exercise per Session: 30 min  Stress: No Stress Concern Present (04/17/2024)   [employer] of Occupational Health - Occupational Stress Questionnaire    Feeling of Stress: Not at all  Social Connections: Socially Integrated (04/17/2024)   Social Connection and Isolation Panel    Frequency of Communication with Friends and Family: Twice a week    Frequency of Social Gatherings with Friends and Family: Twice a week    Attends  Religious Services: More than 4 times per year    Active Member of [employer] or Organizations: Yes    Attends Engineer, structural: More than 4 times per year    Marital Status: Living with partner  Intimate Partner Violence: Not on file     Constitutional: Pt reports fatigue. Denies fever, malaise, headache or abrupt weight changes.  Respiratory: Denies difficulty breathing, shortness of breath, cough or sputum production.   Cardiovascular: Denies chest pain, chest tightness, palpitations or swelling in the hands or feet.  Gastrointestinal: Denies abdominal pain, bloating, constipation, diarrhea or blood in the stool.  GU: Denies urgency, frequency, pain with urination, burning sensation, blood in urine, odor or discharge.  No other specific complaints in a complete review of systems (except as listed in HPI above).      Objective:   Physical Exam BP 124/82 (BP Location: Right Arm, Patient Position: Sitting, Cuff Size: Normal)   Ht 5' 6 (1.676 m)   Wt 156 lb 12.8 oz (71.1 kg)   LMP  (LMP Unknown) Comment: Hysterectomy  BMI 25.31 kg/m     Wt Readings from Last 3 Encounters:  04/17/24 155 lb 6.4 oz (70.5 kg)  02/19/24 155 lb 3.2 oz (70.4 kg)  08/16/23 153 lb 12.8 oz (69.8 kg)    General: Appears her stated age, overweight, in NAD. Cardiovascular: Normal rate and rhythm. S1,S2 noted.  No murmur, rubs or gallops noted.  Pulmonary/Chest: Normal effort and positive vesicular breath sounds. No respiratory distress. No Abdomen: Soft, nontender over the bladder. No CVA tenderness.   Neurological: Alert and oriented. Coordination normal.  Psychiatric: Mood and affect normal. Behavior is normal. Judgment and thought content normal.    BMET    Component Value Date/Time   NA 143 07/09/2023 0921   NA 141 11/19/2020 1049   K 4.1 07/09/2023 0921   CL 108 07/09/2023 0921   CO2 27 07/09/2023 0921   GLUCOSE 96 07/09/2023 0921   BUN 20 07/09/2023 0921   BUN 19 11/19/2020 1049    CREATININE 0.73 07/09/2023 0921   CALCIUM 9.5 07/09/2023 0921   GFRNONAA 95 11/19/2020 1049   GFRAA 110 11/19/2020 1049    Lipid Panel     Component Value Date/Time   CHOL 232 (H) 07/09/2023 0921   CHOL 265 (H) 11/19/2020 1049   TRIG 224 (H) 07/09/2023 0921   HDL 37 (L) 07/09/2023 0921   HDL 39 (L) 11/19/2020 1049   CHOLHDL 6.3 (H) 07/09/2023 0921   VLDL 42.6 (H) 06/27/2016 0950   LDLCALC 157 (H) 07/09/2023 0921    CBC    Component Value Date/Time   WBC 5.7 07/09/2023 0921  RBC 4.81 07/09/2023 0921   HGB 15.0 07/09/2023 0921   HGB 15.0 11/03/2019 1115   HCT 45.2 (H) 07/09/2023 0921   HCT 43.9 11/03/2019 1115   PLT 334 07/09/2023 0921   PLT 377 11/03/2019 1115   MCV 94.0 07/09/2023 0921   MCV 90 11/03/2019 1115   MCH 31.2 07/09/2023 0921   MCHC 33.2 07/09/2023 0921   RDW 12.6 07/09/2023 0921   RDW 12.6 11/03/2019 1115    Hgb A1C Lab Results  Component Value Date   HGBA1C 5.6 07/09/2023            Assessment & Plan:  Assessment and Plan    Urgent care followup for urinary tract infection Urgent care notes and labs reviewed. Recent UTI with hematuria treated with deflex. Urine culture showed E. coli sensitive to Keflex . Hematuria resolved, asymptomatic. Isolated incident likely due to postmenopausal status. - Monitor for recurrence of symptoms. - No repeat urinalysis needed.      RTC in 1 months for your annual exam Angeline Laura, NP

## 2024-05-27 NOTE — Patient Instructions (Signed)

## 2024-05-30 ENCOUNTER — Telehealth: Payer: Self-pay

## 2024-05-30 NOTE — Telephone Encounter (Signed)
 Copied from CRM #[id number]. Topic: General - Other >> May 30, 2024 10:34 AM Daved SQUIBB wrote: Reason for CRM: Earnie from doctor [facility] office called to see if doctor will be willing to set up a peer to peer with Doctor Haug[place]rook. [phone]

## 2024-06-09 DIAGNOSIS — H25013 Cortical age-related cataract, bilateral: Secondary | ICD-10-CM | POA: Diagnosis not present

## 2024-06-09 DIAGNOSIS — H04123 Dry eye syndrome of bilateral lacrimal glands: Secondary | ICD-10-CM | POA: Diagnosis not present

## 2024-06-09 DIAGNOSIS — Z9889 Other specified postprocedural states: Secondary | ICD-10-CM | POA: Diagnosis not present

## 2024-06-20 ENCOUNTER — Ambulatory Visit: Admitting: Podiatry

## 2024-06-20 ENCOUNTER — Encounter: Payer: Self-pay | Admitting: Podiatry

## 2024-06-20 VITALS — Ht 66.0 in | Wt 156.8 lb

## 2024-06-20 DIAGNOSIS — B351 Tinea unguium: Secondary | ICD-10-CM | POA: Diagnosis not present

## 2024-06-20 MED ORDER — TERBINAFINE HCL 250 MG PO TABS: 250.0000 mg | ORAL_TABLET | Freq: Every day | ORAL | 0 refills | Status: DC

## 2024-06-20 NOTE — Progress Notes (Signed)
   Chief Complaint  Patient presents with   Nail Problem    Pt is here due to toenail fungus and corn the in between her left second and third toes that's bothering her.    Subjective: 63 y.o. female presenting today as a reestablish new patient for evaluation of thickening with discoloration of the toenails bilateral.  Onset for several years.  Past Medical History:  Diagnosis Date   History of colon polyps     Past Surgical History:  Procedure Laterality Date   ABDOMINAL HYSTERECTOMY  2015   total   APPENDECTOMY     COLONOSCOPY WITH PROPOFOL  N/A 12/20/2018   Procedure: COLONOSCOPY WITH PROPOFOL ;  Surgeon: Janalyn Keene NOVAK, MD;  Location: [facility] ENDOSCOPY;  Service: Endoscopy;  Laterality: N/A;   COLONOSCOPY WITH PROPOFOL  N/A 08/16/2023   Procedure: COLONOSCOPY WITH PROPOFOL ;  Surgeon: Therisa Bi, MD;  Location: [facility] ENDOSCOPY;  Service: Gastroenterology;  Laterality: N/A;   POLYPECTOMY  08/16/2023   Procedure: POLYPECTOMY;  Surgeon: Therisa Bi, MD;  Location: [facility] ENDOSCOPY;  Service: Gastroenterology;;   SHELLEE TOOTH EXTRACTION      No Known Allergies   RT fifth toe 06/20/2024  LT great toe 06/20/2024  Objective: Physical Exam General: The patient is alert and oriented x3 in no acute distress.  Dermatology: Hyperkeratotic, discolored, thickened, onychodystrophy noted. Skin is warm, dry and supple bilateral lower extremities. Negative for open lesions or macerations.  Vascular: Palpable pedal pulses bilaterally. No edema or erythema noted. Capillary refill within normal limits.  Neurological: Grossly intact via light touch  Musculoskeletal Exam: No pedal deformity noted  Assessment: #1 Onychomycosis of toenails bilateral  Plan of Care:  -Patient was evaluated. -Today we discussed different treatment options including oral, topical, and laser antifungal treatment modalities.  We discussed their efficacies and side effects.  Patient opts for oral antifungal  treatment modality -Prescription for Lamisil  250 mg #90 daily. Pt denies a history of liver pathology or symptoms.  Patient is otherwise healthy -Return to clinic 6 months  *Has [place]  [facility]  Thresa EMERSON Sar, DPM [facility]  Dr. Thresa EMERSON Sar, DPM    [address] [place], [place] [address]                Office [phone]  Fax [phone]

## 2024-06-22 ENCOUNTER — Encounter: Payer: Self-pay | Admitting: Internal Medicine

## 2024-07-10 ENCOUNTER — Encounter: Payer: Self-pay | Admitting: Internal Medicine

## 2024-07-10 ENCOUNTER — Ambulatory Visit (INDEPENDENT_AMBULATORY_CARE_PROVIDER_SITE_OTHER): Admitting: Internal Medicine

## 2024-07-10 VITALS — BP 118/74 | Ht 66.0 in | Wt 152.8 lb

## 2024-07-10 DIAGNOSIS — R739 Hyperglycemia, unspecified: Secondary | ICD-10-CM

## 2024-07-10 DIAGNOSIS — E782 Mixed hyperlipidemia: Secondary | ICD-10-CM

## 2024-07-10 DIAGNOSIS — Z0001 Encounter for general adult medical examination with abnormal findings: Secondary | ICD-10-CM

## 2024-07-10 DIAGNOSIS — Z23 Encounter for immunization: Secondary | ICD-10-CM | POA: Diagnosis not present

## 2024-07-10 NOTE — Progress Notes (Signed)
 Subjective:    Patient ID: Bridget Burns, female    DOB: 1960/12/10, 63 y.o.   MRN: [id number]  HPI  Patient presents to clinic today for her annual exam.  Flu: 06/2023 Tetanus: 06/2016 COVID: X 2 Shingrix: 07/2022, 09/2022 Pap smear: Hysterectomy Mammogram: 04/2024 Bone density: 09/2022 Colon screening: 07/2023 Vision screening: annually Dentist: biannually  Diet: She does eat some meat. She does consumes fruits and veggies. She tries to avoid fried foods. She drinks mostly dt soda, dt vitamin water. Exercise: gym 1 x day   Review of Systems  Past Medical History:  Diagnosis Date  . History of colon polyps     Current Outpatient Medications  Medication Sig Dispense Refill  . Ascorbic Acid (VITAMIN C) 1000 MG tablet Take 1,000 mg by mouth daily.    . Calcium  Carb-Cholecalciferol (CALCIUM  600 + D PO) Take 1-2 tablets by mouth daily.    . cholecalciferol (VITAMIN D3) 25 MCG (1000 UNIT) tablet Take 1,000 Units by mouth daily.    . estradiol  (ESTRACE ) 0.1 MG/GM vaginal cream Place 1 Applicatorful vaginally 2 (two) times a week. 42.5 g 2  . Omega-3 Fatty Acids (FISH OIL) 1000 MG CAPS Take 1 capsule by mouth daily.    . terbinafine  (LAMISIL ) 250 MG tablet Take 1 tablet (250 mg total) by mouth daily. 90 tablet 0  . zinc gluconate 50 MG tablet Take 50 mg by mouth daily.     No current facility-administered medications for this visit.    No Known Allergies  Family History  Problem Relation Age of Onset  . Hypertension Mother   . Other Father        Brain Tumor, benign  . Pancreatic cancer Maternal Aunt        65?  SABRA Breast cancer Neg Hx     Social History   Socioeconomic History  . Marital status: Single    Spouse name: Not on file  . Number of children: Not on file  . Years of education: Not on file  . Highest education level: Bachelor's degree (e.g., BA, AB, BS)  Occupational History  . Not on file  Tobacco Use  . Smoking status: Never  . Smokeless  tobacco: Never  Vaping Use  . Vaping status: Never Used  Substance and Sexual Activity  . Alcohol use: No  . Drug use: No  . Sexual activity: Yes    Birth control/protection: Surgical    Comment: Hysterectomy  Other Topics Concern  . Not on file  Social History Narrative  . Not on file   Social Drivers of Health   Financial Resource Strain: Low Risk  (04/17/2024)   Overall Financial Resource Strain (CARDIA)   . Difficulty of Paying Living Expenses: Not hard at all  Food Insecurity: No Food Insecurity (04/17/2024)   Hunger Vital Sign   . Worried About Programme researcher, broadcasting/film/video in the Last Year: Never true   . Ran Out of Food in the Last Year: Never true  Transportation Needs: No Transportation Needs (04/17/2024)   PRAPARE - Transportation   . Lack of Transportation (Medical): No   . Lack of Transportation (Non-Medical): No  Physical Activity: Sufficiently Active (04/17/2024)   Exercise Vital Sign   . Days of Exercise per Week: 5 days   . Minutes of Exercise per Session: 30 min  Stress: No Stress Concern Present (04/17/2024)   [employer] of Occupational Health - Occupational Stress Questionnaire   . Feeling of Stress: Not at all  Social Connections: Socially Integrated (04/17/2024)   Social Connection and Isolation Panel   . Frequency of Communication with Friends and Family: Twice a week   . Frequency of Social Gatherings with Friends and Family: Twice a week   . Attends Religious Services: More than 4 times per year   . Active Member of Clubs or Organizations: Yes   . Attends Banker Meetings: More than 4 times per year   . Marital Status: Living with partner  Intimate Partner Violence: Not on file     Constitutional: Pt reports chronic fatigue. Denies fever, malaise, headache or abrupt weight changes.  HEENT: Denies eye pain, eye redness, ear pain, ringing in the ears, wax buildup, runny nose, nasal congestion, bloody nose, or sore throat. Respiratory: Denies  difficulty breathing, shortness of breath, cough or sputum production.   Cardiovascular: Denies chest pain, chest tightness, palpitations or swelling in the hands or feet.  Gastrointestinal: Denies abdominal pain, bloating, constipation, diarrhea or blood in the stool.  GU: Denies urgency, frequency, pain with urination, burning sensation, blood in urine, odor or discharge. Musculoskeletal: Patient reports chronic wrist pain.  Denies decrease in range of motion, difficulty with gait, muscle pain or joint swelling.  Skin: Denies redness, rashes, lesions or ulcercations.  Neurological: Denies dizziness, difficulty with memory, difficulty with speech or problems with balance and coordination.  Psych: Denies anxiety, depression, SI/HI.  No other specific complaints in a complete review of systems (except as listed in HPI above).     Objective:   Physical Exam   BP 118/74 (BP Location: Left Arm, Patient Position: Sitting, Cuff Size: Normal)   Ht 5' 6 (1.676 m)   Wt 152 lb 12.8 oz (69.3 kg)   LMP  (LMP Unknown) Comment: Hysterectomy  BMI 24.66 kg/m    Wt Readings from Last 3 Encounters:  06/20/24 156 lb 12.8 oz (71.1 kg)  05/27/24 156 lb 12.8 oz (71.1 kg)  04/17/24 155 lb 6.4 oz (70.5 kg)    General: Appears her stated age, well developed, well nourished in NAD. Skin: Warm, dry and intact.  HEENT: Head: normal shape and size; Eyes: sclera white, no icterus, conjunctiva pink, PERRLA and EOMs intact;  Neck:  Neck supple, trachea midline. No masses, lumps or thyromegaly present.  Cardiovascular: Normal rate and rhythm. S1,S2 noted.  No murmur, rubs or gallops noted. No JVD or BLE edema. No carotid bruits noted. Pulmonary/Chest: Normal effort and positive vesicular breath sounds. No respiratory distress. No wheezes, rales or ronchi noted.  Abdomen: Soft and nontender. Normal bowel sounds.  Musculoskeletal: Strength 5/5 BUE/BLE. No difficulty with gait.  Neurological: Alert and oriented.  Cranial nerves II-XII grossly intact. Coordination normal.  Psychiatric: Mood and affect normal. Behavior is normal. Judgment and thought content normal.    BMET    Component Value Date/Time   NA 143 07/09/2023 0921   NA 141 11/19/2020 1049   K 4.1 07/09/2023 0921   CL 108 07/09/2023 0921   CO2 27 07/09/2023 0921   GLUCOSE 96 07/09/2023 0921   BUN 20 07/09/2023 0921   BUN 19 11/19/2020 1049   CREATININE 0.73 07/09/2023 0921   CALCIUM  9.5 07/09/2023 0921   GFRNONAA 95 11/19/2020 1049   GFRAA 110 11/19/2020 1049    Lipid Panel     Component Value Date/Time   CHOL 232 (H) 07/09/2023 0921   CHOL 265 (H) 11/19/2020 1049   TRIG 224 (H) 07/09/2023 0921   HDL 37 (L) 07/09/2023 0921   HDL  39 (L) 11/19/2020 1049   CHOLHDL 6.3 (H) 07/09/2023 0921   VLDL 42.6 (H) 06/27/2016 0950   LDLCALC 157 (H) 07/09/2023 0921    CBC    Component Value Date/Time   WBC 5.7 07/09/2023 0921   RBC 4.81 07/09/2023 0921   HGB 15.0 07/09/2023 0921   HGB 15.0 11/03/2019 1115   HCT 45.2 (H) 07/09/2023 0921   HCT 43.9 11/03/2019 1115   PLT 334 07/09/2023 0921   PLT 377 11/03/2019 1115   MCV 94.0 07/09/2023 0921   MCV 90 11/03/2019 1115   MCH 31.2 07/09/2023 0921   MCHC 33.2 07/09/2023 0921   RDW 12.6 07/09/2023 0921   RDW 12.6 11/03/2019 1115    Hgb A1C Lab Results  Component Value Date   HGBA1C 5.6 07/09/2023           Assessment & Plan:   Preventative health maintenance:  Flu shot today Tetanus UTD Encouraged her to get her COVID booster Shingrix UTD She no longer needs to screen for cervical cancer Mammogram UTD Bone density UTD Colon screening UTD Encouraged her to consume a balanced diet and exercise regimen Advised her to see an eye doctor and dentist annually We will check CBC, c-Met, lipid, A1c today  RTC in 6 months, follow-up chronic conditions Angeline Laura, NP

## 2024-07-10 NOTE — Patient Instructions (Signed)
 Health Maintenance for Postmenopausal Women Menopause is a normal process in which your ability to get pregnant comes to an end. This process happens slowly over many months or years, usually between the ages of 76 and 38. Menopause is complete when you have missed your menstrual period for 12 months. It is important to talk with your health care provider about some of the most common conditions that affect women after menopause (postmenopausal women). These include heart disease, cancer, and bone loss (osteoporosis). Adopting a healthy lifestyle and getting preventive care can help to promote your health and wellness. The actions you take can also lower your chances of developing some of these common conditions. What are the signs and symptoms of menopause? During menopause, you may have the following symptoms: Hot flashes. These can be moderate or severe. Night sweats. Decrease in sex drive. Mood swings. Headaches. Tiredness (fatigue). Irritability. Memory problems. Problems falling asleep or staying asleep. Talk with your health care provider about treatment options for your symptoms. Do I need hormone replacement therapy? Hormone replacement therapy is effective in treating symptoms that are caused by menopause, such as hot flashes and night sweats. Hormone replacement carries certain risks, especially as you become older. If you are thinking about using estrogen or estrogen with progestin, discuss the benefits and risks with your health care provider. How can I reduce my risk for heart disease and stroke? The risk of heart disease, heart attack, and stroke increases as you age. One of the causes may be a change in the body's hormones during menopause. This can affect how your body uses dietary fats, triglycerides, and cholesterol. Heart attack and stroke are medical emergencies. There are many things that you can do to help prevent heart disease and stroke. Watch your blood pressure High  blood pressure causes heart disease and increases the risk of stroke. This is more likely to develop in people who have high blood pressure readings or are overweight. Have your blood pressure checked: Every 3-5 years if you are 32-23 years of age. Every year if you are 31 years old or older. Eat a healthy diet  Eat a diet that includes plenty of vegetables, fruits, low-fat dairy products, and lean protein. Do not eat a lot of foods that are high in solid fats, added sugars, or sodium. Get regular exercise Get regular exercise. This is one of the most important things you can do for your health. Most adults should: Try to exercise for at least 150 minutes each week. The exercise should increase your heart rate and make you sweat (moderate-intensity exercise). Try to do strengthening exercises at least twice each week. Do these in addition to the moderate-intensity exercise. Spend less time sitting. Even light physical activity can be beneficial. Other tips Work with your health care provider to achieve or maintain a healthy weight. Do not use any products that contain nicotine or tobacco. These products include cigarettes, chewing tobacco, and vaping devices, such as e-cigarettes. If you need help quitting, ask your health care provider. Know your numbers. Ask your health care provider to check your cholesterol and your blood sugar (glucose). Continue to have your blood tested as directed by your health care provider. Do I need screening for cancer? Depending on your health history and family history, you may need to have cancer screenings at different stages of your life. This may include screening for: Breast cancer. Cervical cancer. Lung cancer. Colorectal cancer. What is my risk for osteoporosis? After menopause, you may be  at increased risk for osteoporosis. Osteoporosis is a condition in which bone destruction happens more quickly than new bone creation. To help prevent osteoporosis or  the bone fractures that can happen because of osteoporosis, you may take the following actions: If you are 24-54 years old, get at least 1,000 mg of calcium and at least 600 international units (IU) of vitamin D  per day. If you are older than age 75 but younger than age 30, get at least 1,200 mg of calcium and at least 600 international units (IU) of vitamin D  per day. If you are older than age 8, get at least 1,200 mg of calcium and at least 800 international units (IU) of vitamin D  per day. Smoking and drinking excessive alcohol increase the risk of osteoporosis. Eat foods that are rich in calcium and vitamin D , and do weight-bearing exercises several times each week as directed by your health care provider. How does menopause affect my mental health? Depression may occur at any age, but it is more common as you become older. Common symptoms of depression include: Feeling depressed. Changes in sleep patterns. Changes in appetite or eating patterns. Feeling an overall lack of motivation or enjoyment of activities that you previously enjoyed. Frequent crying spells. Talk with your health care provider if you think that you are experiencing any of these symptoms. General instructions See your health care provider for regular wellness exams and vaccines. This may include: Scheduling regular health, dental, and eye exams. Getting and maintaining your vaccines. These include: Influenza vaccine. Get this vaccine each year before the flu season begins. Pneumonia vaccine. Shingles vaccine. Tetanus, diphtheria, and pertussis (Tdap) booster vaccine. Your health care provider may also recommend other immunizations. Tell your health care provider if you have ever been abused or do not feel safe at home. Summary Menopause is a normal process in which your ability to get pregnant comes to an end. This condition causes hot flashes, night sweats, decreased interest in sex, mood swings, headaches, or lack  of sleep. Treatment for this condition may include hormone replacement therapy. Take actions to keep yourself healthy, including exercising regularly, eating a healthy diet, watching your weight, and checking your blood pressure and blood sugar levels. Get screened for cancer and depression. Make sure that you are up to date with all your vaccines. This information is not intended to replace advice given to you by your health care provider. Make sure you discuss any questions you have with your health care provider. Document Revised: 02/21/2021 Document Reviewed: 02/21/2021 Elsevier Patient Education  2024 [employer].

## 2024-07-11 ENCOUNTER — Ambulatory Visit: Payer: Self-pay | Admitting: Internal Medicine

## 2024-07-11 DIAGNOSIS — E782 Mixed hyperlipidemia: Secondary | ICD-10-CM

## 2024-07-11 LAB — COMPREHENSIVE METABOLIC PANEL WITH GFR
AG Ratio: 1.7 (calc) (ref 1.0–2.5)
ALT: 10 U/L (ref 6–29)
AST: 13 U/L (ref 10–35)
Albumin: 4.4 g/dL (ref 3.6–5.1)
Alkaline phosphatase (APISO): 102 U/L (ref 37–153)
BUN: 19 mg/dL (ref 7–25)
CO2: 27 mmol/L (ref 20–32)
Calcium: 9.4 mg/dL (ref 8.6–10.4)
Chloride: 105 mmol/L (ref 98–110)
Creat: 0.66 mg/dL (ref 0.50–1.05)
Globulin: 2.6 g/dL (ref 1.9–3.7)
Glucose, Bld: 94 mg/dL (ref 65–99)
Potassium: 4.3 mmol/L (ref 3.5–5.3)
Sodium: 141 mmol/L (ref 135–146)
Total Bilirubin: 1.1 mg/dL (ref 0.2–1.2)
Total Protein: 7 g/dL (ref 6.1–8.1)
eGFR: 98 mL/min/1.73m2 (ref >=60)

## 2024-07-11 LAB — LIPID PANEL
Cholesterol: 232 mg/dL — ABNORMAL HIGH (ref <200)
HDL: 36 mg/dL — ABNORMAL LOW (ref >=50)
LDL Cholesterol (Calc): 157 mg/dL — ABNORMAL HIGH
Non-HDL Cholesterol (Calc): 196 mg/dL — ABNORMAL HIGH (ref <130)
Total CHOL/HDL Ratio: 6.4 (calc) — ABNORMAL HIGH (ref <5.0)
Triglycerides: 229 mg/dL — ABNORMAL HIGH (ref <150)

## 2024-07-11 LAB — HEMOGLOBIN A1C
Hgb A1c MFr Bld: 5.6 % (ref <5.7)
Mean Plasma Glucose: 114 mg/dL
eAG (mmol/L): 6.3 mmol/L

## 2024-07-11 LAB — CBC
HCT: 45 % (ref 35.0–45.0)
Hemoglobin: 15.2 g/dL (ref 11.7–15.5)
MCH: 31.3 pg (ref 27.0–33.0)
MCHC: 33.8 g/dL (ref 32.0–36.0)
MCV: 92.6 fL (ref 80.0–100.0)
MPV: 10.1 fL (ref 7.5–12.5)
Platelets: 341 Thousand/uL (ref 140–400)
RBC: 4.86 Million/uL (ref 3.80–5.10)
RDW: 13.3 % (ref 11.0–15.0)
WBC: 6.4 Thousand/uL (ref 3.8–10.8)

## 2024-07-11 MED ORDER — ATORVASTATIN CALCIUM 10 MG PO TABS: 10.0000 mg | ORAL_TABLET | Freq: Every day | ORAL | 1 refills | Status: AC

## 2024-07-14 NOTE — Telephone Encounter (Signed)
 Will fill out forms upon my return on Tuesday. No appt nedded

## 2024-07-16 DIAGNOSIS — Z0279 Encounter for issue of other medical certificate: Secondary | ICD-10-CM

## 2024-09-12 ENCOUNTER — Other Ambulatory Visit: Payer: Self-pay | Admitting: Podiatry

## 2024-09-15 ENCOUNTER — Encounter: Payer: Self-pay | Admitting: Podiatry

## 2024-09-30 ENCOUNTER — Ambulatory Visit: Admitting: Podiatry

## 2024-09-30 ENCOUNTER — Encounter: Payer: Self-pay | Admitting: Podiatry

## 2024-09-30 VITALS — Ht 66.0 in | Wt 152.8 lb

## 2024-09-30 DIAGNOSIS — Z79899 Other long term (current) drug therapy: Secondary | ICD-10-CM

## 2024-09-30 DIAGNOSIS — B351 Tinea unguium: Secondary | ICD-10-CM

## 2024-09-30 MED ORDER — TERBINAFINE HCL 250 MG PO TABS: 250.0000 mg | ORAL_TABLET | Freq: Every day | ORAL | 0 refills | Status: AC

## 2024-09-30 NOTE — Progress Notes (Signed)
° °  No chief complaint on file.   Subjective: 63 y.o. female presenting today for follow-up evaluation of thickening with discoloration of the toenails bilateral.  Onset for several years.  Past Medical History:  Diagnosis Date   History of colon polyps     Past Surgical History:  Procedure Laterality Date   ABDOMINAL HYSTERECTOMY  2015   total   APPENDECTOMY     COLONOSCOPY WITH PROPOFOL  N/A 12/20/2018   Procedure: COLONOSCOPY WITH PROPOFOL ;  Surgeon: Janalyn Keene NOVAK, MD;  Location: [facility] ENDOSCOPY;  Service: Endoscopy;  Laterality: N/A;   COLONOSCOPY WITH PROPOFOL  N/A 08/16/2023   Procedure: COLONOSCOPY WITH PROPOFOL ;  Surgeon: Therisa Bi, MD;  Location: [facility] ENDOSCOPY;  Service: Gastroenterology;  Laterality: N/A;   POLYPECTOMY  08/16/2023   Procedure: POLYPECTOMY;  Surgeon: Therisa Bi, MD;  Location: [facility] ENDOSCOPY;  Service: Gastroenterology;;   SHELLEE TOOTH EXTRACTION      No Known Allergies   RT fifth toe 06/20/2024  LT great toe 06/20/2024  Objective: Physical Exam General: The patient is alert and oriented x3 in no acute distress.  Dermatology: there continues to be hyperkeratotic, discolored, thickened, onychodystrophy noted. Skin is warm, dry and supple bilateral lower extremities. Negative for open lesions or macerations.  Vascular: Palpable pedal pulses bilaterally. No edema or erythema noted. Capillary refill within normal limits.  Neurological: Grossly intact via light touch  Musculoskeletal Exam: No pedal deformity noted  Assessment: #1 Onychomycosis of toenails bilateral  Plan of Care:  -Patient was evaluated. -Today we discussed different treatment options including oral, topical, and laser antifungal treatment modalities.  We discussed their efficacies and side effects.  Patient opts for oral antifungal treatment modality -Refill prescription for Lamisil  250 mg #90 daily. Pt denies a history of liver pathology or symptoms.  Patient is otherwise  healthy -Return to clinic 6 months  *Has [place]  [facility]  Thresa EMERSON Sar, DPM [facility]  Dr. Thresa EMERSON Sar, DPM    [address] [place], [place] [address]                Office [phone]  Fax [phone]

## 2024-10-01 LAB — HEPATIC FUNCTION PANEL
ALT: 20 IU/L (ref 0–32)
AST: 18 IU/L (ref 0–40)
Albumin: 4.4 g/dL (ref 3.9–4.9)
Alkaline Phosphatase: 141 IU/L — ABNORMAL HIGH (ref 49–135)
Bilirubin Total: 0.8 mg/dL (ref 0.0–1.2)
Bilirubin, Direct: 0.24 mg/dL (ref 0.00–0.40)
Total Protein: 6.9 g/dL (ref 6.0–8.5)

## 2024-10-14 ENCOUNTER — Encounter: Payer: Self-pay | Admitting: Internal Medicine

## 2024-10-17 DIAGNOSIS — Z0279 Encounter for issue of other medical certificate: Secondary | ICD-10-CM

## 2024-12-19 ENCOUNTER — Ambulatory Visit: Admitting: Podiatry

## 2025-01-09 ENCOUNTER — Ambulatory Visit: Admitting: Internal Medicine
